# Patient Record
Sex: Female | Born: 1950 | Race: White | Hispanic: No | Marital: Married | Smoking: Never smoker
Health system: Southern US, Community
[De-identification: ages and names within clinical notes are randomized; demographics above are authoritative.]

## PROBLEM LIST (undated history)

## (undated) DIAGNOSIS — H04123 Dry eye syndrome of bilateral lacrimal glands: Secondary | ICD-10-CM

## (undated) DIAGNOSIS — F32A Depression, unspecified: Secondary | ICD-10-CM

## (undated) DIAGNOSIS — N83209 Unspecified ovarian cyst, unspecified side: Secondary | ICD-10-CM

## (undated) DIAGNOSIS — IMO0002 Reserved for concepts with insufficient information to code with codable children: Secondary | ICD-10-CM

## (undated) DIAGNOSIS — Z8669 Personal history of other diseases of the nervous system and sense organs: Secondary | ICD-10-CM

## (undated) DIAGNOSIS — Z5189 Encounter for other specified aftercare: Secondary | ICD-10-CM

## (undated) DIAGNOSIS — L570 Actinic keratosis: Secondary | ICD-10-CM

## (undated) DIAGNOSIS — F419 Anxiety disorder, unspecified: Secondary | ICD-10-CM

## (undated) DIAGNOSIS — Z8742 Personal history of other diseases of the female genital tract: Secondary | ICD-10-CM

## (undated) DIAGNOSIS — F329 Major depressive disorder, single episode, unspecified: Secondary | ICD-10-CM

## (undated) HISTORY — DX: Personal history of other diseases of the female genital tract: Z87.42

## (undated) HISTORY — DX: Reserved for concepts with insufficient information to code with codable children: IMO0002

## (undated) HISTORY — DX: Unspecified ovarian cyst, unspecified side: N83.209

## (undated) HISTORY — DX: Encounter for other specified aftercare: Z51.89

## (undated) HISTORY — PX: TONSILLECTOMY: SUR1361

## (undated) HISTORY — PX: CHOLECYSTECTOMY: SHX55

## (undated) HISTORY — DX: Actinic keratosis: L57.0

## (undated) HISTORY — PX: APPENDECTOMY: SHX54

## (undated) HISTORY — DX: Anxiety disorder, unspecified: F41.9

## (undated) HISTORY — DX: Dry eye syndrome of bilateral lacrimal glands: H04.123

## (undated) HISTORY — DX: Major depressive disorder, single episode, unspecified: F32.9

## (undated) HISTORY — DX: Personal history of other diseases of the nervous system and sense organs: Z86.69

## (undated) HISTORY — DX: Depression, unspecified: F32.A

---

## 1968-09-13 HISTORY — PX: RIGHT OOPHORECTOMY: SHX2359

## 1985-09-13 HISTORY — PX: ABDOMINAL HYSTERECTOMY: SHX81

## 1988-09-13 HISTORY — PX: LAPAROSCOPY: SHX197

## 1998-02-13 ENCOUNTER — Other Ambulatory Visit: Admission: RE | Admit: 1998-02-13 | Discharge: 1998-02-13 | Payer: Self-pay | Admitting: Obstetrics and Gynecology

## 1998-02-19 ENCOUNTER — Other Ambulatory Visit: Admission: RE | Admit: 1998-02-19 | Discharge: 1998-02-19 | Payer: Self-pay | Admitting: *Deleted

## 1999-06-24 ENCOUNTER — Other Ambulatory Visit: Admission: RE | Admit: 1999-06-24 | Discharge: 1999-06-24 | Payer: Self-pay | Admitting: Obstetrics and Gynecology

## 1999-07-29 ENCOUNTER — Ambulatory Visit (HOSPITAL_COMMUNITY): Admission: RE | Admit: 1999-07-29 | Discharge: 1999-07-29 | Payer: Self-pay | Admitting: Obstetrics and Gynecology

## 2000-10-13 ENCOUNTER — Other Ambulatory Visit: Admission: RE | Admit: 2000-10-13 | Discharge: 2000-10-13 | Payer: Self-pay | Admitting: Obstetrics and Gynecology

## 2001-12-14 ENCOUNTER — Other Ambulatory Visit: Admission: RE | Admit: 2001-12-14 | Discharge: 2001-12-14 | Payer: Self-pay | Admitting: Obstetrics and Gynecology

## 2003-02-18 ENCOUNTER — Other Ambulatory Visit: Admission: RE | Admit: 2003-02-18 | Discharge: 2003-02-18 | Payer: Self-pay | Admitting: Obstetrics and Gynecology

## 2004-06-30 ENCOUNTER — Other Ambulatory Visit: Admission: RE | Admit: 2004-06-30 | Discharge: 2004-06-30 | Payer: Self-pay | Admitting: Obstetrics and Gynecology

## 2005-07-12 ENCOUNTER — Ambulatory Visit: Payer: Self-pay | Admitting: Internal Medicine

## 2005-10-11 ENCOUNTER — Ambulatory Visit: Payer: Self-pay | Admitting: Internal Medicine

## 2005-10-22 ENCOUNTER — Ambulatory Visit: Payer: Self-pay | Admitting: Internal Medicine

## 2005-10-25 ENCOUNTER — Ambulatory Visit: Payer: Self-pay | Admitting: Internal Medicine

## 2005-10-27 ENCOUNTER — Encounter: Admission: RE | Admit: 2005-10-27 | Discharge: 2005-10-27 | Payer: Self-pay | Admitting: Internal Medicine

## 2005-10-29 ENCOUNTER — Ambulatory Visit (HOSPITAL_COMMUNITY): Admission: RE | Admit: 2005-10-29 | Discharge: 2005-10-31 | Payer: Self-pay | Admitting: General Surgery

## 2005-10-30 ENCOUNTER — Ambulatory Visit (HOSPITAL_COMMUNITY): Admission: RE | Admit: 2005-10-30 | Discharge: 2005-10-31 | Payer: Self-pay | Admitting: General Surgery

## 2005-10-30 ENCOUNTER — Encounter (INDEPENDENT_AMBULATORY_CARE_PROVIDER_SITE_OTHER): Payer: Self-pay | Admitting: Specialist

## 2005-10-30 ENCOUNTER — Encounter: Payer: Self-pay | Admitting: General Surgery

## 2005-11-03 ENCOUNTER — Encounter: Payer: Self-pay | Admitting: Internal Medicine

## 2005-12-14 ENCOUNTER — Other Ambulatory Visit: Admission: RE | Admit: 2005-12-14 | Discharge: 2005-12-14 | Payer: Self-pay | Admitting: Obstetrics and Gynecology

## 2006-08-18 ENCOUNTER — Ambulatory Visit: Payer: Self-pay | Admitting: Internal Medicine

## 2007-06-06 ENCOUNTER — Other Ambulatory Visit: Admission: RE | Admit: 2007-06-06 | Discharge: 2007-06-06 | Payer: Self-pay | Admitting: Obstetrics and Gynecology

## 2007-07-11 ENCOUNTER — Encounter: Payer: Self-pay | Admitting: Internal Medicine

## 2007-07-11 LAB — HM DEXA SCAN: HM Dexa Scan: NORMAL

## 2007-08-23 ENCOUNTER — Ambulatory Visit: Payer: Self-pay | Admitting: Internal Medicine

## 2008-05-01 ENCOUNTER — Telehealth (INDEPENDENT_AMBULATORY_CARE_PROVIDER_SITE_OTHER): Payer: Self-pay | Admitting: *Deleted

## 2008-05-01 ENCOUNTER — Ambulatory Visit: Payer: Self-pay | Admitting: Internal Medicine

## 2008-05-01 ENCOUNTER — Ambulatory Visit: Payer: Self-pay | Admitting: Cardiology

## 2008-05-01 DIAGNOSIS — R109 Unspecified abdominal pain: Secondary | ICD-10-CM

## 2008-05-01 DIAGNOSIS — F329 Major depressive disorder, single episode, unspecified: Secondary | ICD-10-CM

## 2008-05-01 DIAGNOSIS — H04129 Dry eye syndrome of unspecified lacrimal gland: Secondary | ICD-10-CM | POA: Insufficient documentation

## 2008-05-01 DIAGNOSIS — Z87898 Personal history of other specified conditions: Secondary | ICD-10-CM

## 2008-05-01 LAB — CONVERTED CEMR LAB
ALT: 17 units/L (ref 0–35)
AST: 17 units/L (ref 0–37)
Albumin: 3.9 g/dL (ref 3.5–5.2)
Alkaline Phosphatase: 51 units/L (ref 39–117)
BUN: 22 mg/dL (ref 6–23)
Basophils Relative: 0.8 % (ref 0.0–3.0)
Blood in Urine, dipstick: NEGATIVE
CO2: 29 meq/L (ref 19–32)
Chloride: 105 meq/L (ref 96–112)
Eosinophils Relative: 1.9 % (ref 0.0–5.0)
GFR calc Af Amer: 82 mL/min
Hemoglobin: 13.8 g/dL (ref 12.0–15.0)
MCHC: 34.3 g/dL (ref 30.0–36.0)
Neutro Abs: 3.2 10*3/uL (ref 1.4–7.7)
Potassium: 4.1 meq/L (ref 3.5–5.1)
Protein, U semiquant: NEGATIVE
Specific Gravity, Urine: 1.015
Total Protein: 6.7 g/dL (ref 6.0–8.3)
WBC: 5.6 10*3/uL (ref 4.5–10.5)
pH: 5

## 2008-05-02 ENCOUNTER — Encounter: Payer: Self-pay | Admitting: Internal Medicine

## 2008-05-03 ENCOUNTER — Telehealth: Payer: Self-pay | Admitting: Internal Medicine

## 2008-05-07 ENCOUNTER — Ambulatory Visit: Payer: Self-pay | Admitting: Internal Medicine

## 2008-05-08 ENCOUNTER — Encounter: Admission: RE | Admit: 2008-05-08 | Discharge: 2008-05-08 | Payer: Self-pay | Admitting: Surgery

## 2008-05-08 ENCOUNTER — Encounter: Payer: Self-pay | Admitting: Internal Medicine

## 2008-05-09 ENCOUNTER — Telehealth: Payer: Self-pay | Admitting: Internal Medicine

## 2008-05-09 DIAGNOSIS — M8580 Other specified disorders of bone density and structure, unspecified site: Secondary | ICD-10-CM

## 2008-05-27 ENCOUNTER — Encounter: Payer: Self-pay | Admitting: Internal Medicine

## 2008-06-11 ENCOUNTER — Telehealth: Payer: Self-pay | Admitting: Internal Medicine

## 2010-05-05 ENCOUNTER — Ambulatory Visit: Payer: Self-pay | Admitting: Internal Medicine

## 2010-10-13 NOTE — Assessment & Plan Note (Signed)
Summary: SHINGLES VAC/KN  Nurse Visit   Allergies: 1)  ! Penicillin  Immunizations Administered:  Zostavax # 1:    Vaccine Type: Zostavax    Site: left deltoid    Mfr: Merck    Dose: 0.5 ml    Route: [place]    Given by: Army Fossa CMA    Exp. Date: 04/29/2012    Lot #: [id number]  Orders Added: 1)  Zoster (Shingles) Vaccine Live [90736] 2)  Admin 1st Vaccine [01027]

## 2011-01-29 NOTE — Op Note (Signed)
NAME:  Kelly Schneider, Kelly Schneider NO.:  [id number]   MEDICAL RECORD NO.:  [id number]          PATIENT TYPE:  OIB   LOCATION:  1612                         FACILITY:  [facility]   PHYSICIAN:  Anselm Pancoast. Weatherly, M.D.DATE OF BIRTH:  1950/11/17   DATE OF PROCEDURE:  10/30/2005  DATE OF DISCHARGE:                                 OPERATIVE REPORT   PREOPERATIVE DIAGNOSIS:  Chronic cholecystitis with stones.   POSTOPERATIVE DIAGNOSIS:  Chronic cholecystitis with stones.   OPERATION:  Laparoscopic cholecystectomy with cholangiogram.   SURGEON:  Dr. Consuello Bossier   ASSISTANT:  Dr. Cyndia Bent.   ANESTHESIA:  General.   HISTORY:  Kelly Schneider is a 60 year old Caucasian female, whom I first  saw in the office yesterday with the following history.  She has had some  intermittent episodes of right upper abdominal pain and right shoulder pain.  She sees Dr. __________  as her regular physician and also has never had a  colonoscopy and was scheduled for a colonoscopy that was done,  I think, by  Dr. Leone Payor on Monday.  She felt fine on Monday night and then on Wednesday  night, had an episode of significant abdominal pain and this time saw Dr.  __________ .  They scheduled an ultrasound that was done Thursday that  showed large stone within a swollen gallbladder, but it was not acutely  inflamed.  She was advised to see a surgeon, and I saw her yesterday, and  she gives a history that she has had fever she says for about 2 weeks, but  on the fever it appears that her temperature goes up to 100, is not a  significant fever, and then she said that when she had been seen for this  previously, they had mentioned that she had some blood in her urine, but her  urine culture did not show any bacteria.  I found on physical exam she was  kind of mildly tender in the right upper quadrant, but she was definitely  tender in the lower abdomen and had not had a white count or liver  test, and  I sent her over yesterday afternoon for a CAT scan of the abdomen and pelvis  and also lab studies.  The lab studies were unremarkable, and the CT did not  show any evidence of any inflammation or whatever the colon.  Wondered if  somehow or another the colonoscopy that had been done Monday had kind up  stirred up a problem.  I advised that we go ahead and add her to the OR  schedule, and she is here today for the planned procedure.  She also has had  a past history of endometriosis and has had a hysterectomy with bilateral  salpingo-oophorectomy years ago through a low Pfannenstiel incision and then  one time since then, she has had a laparoscopic lysis of adhesions.  She  said they had to clean out some scar tissue.  But was whether there was a  recurrence of that also is an issue, but nothing was seen on the CT.   Preoperatively, she was given 400  mg of Cipro, taken back to the operative  suite, induction of general anesthesia.  The abdomen was prepped with  Betadine surgical scrub and solution and draped in a sterile manner.  A  small vertical incision was made at the umbilicus.  The fascia was  identified, kind of carefully opened through this, but there is a little  scarring right at the omentum, and we very carefully entered into the  peritoneal cavity.  The little peritoneum was picked up, opened, and then  the pursestring suture placed in the fascia.  Hasson cannula was introduced  in the upper abdomen.  The gallbladder is not acutely inflamed, but it is  significantly distended chronically with a lot adhesions up around it, as  she has had previous episodes of pain.  The upper 10 mm trocar was placed in  the subxiphoid area, and the two 5 mm trocars were placed in the appropriate  right lateral position, straight.  With placing the camera in the upper 10  mm trocar and looking at the pelvis, she has significant, kind of  chronically scarring to her sigmoid colon that  goes to the upper area of the  Pfannenstiel incision, and I think some of this lower abdominal cramping is  probably related to these adhesions to the colon.  There were some little  thin adhesions up around the umbilicus.  These were dropped down before  directing our attention to the gallbladder.  The gallbladder was grasped,  retracted upward and outward, and the omentum that was kind of adherent to  this was kind of carefully stripped down.  Blood vessels were clipped and  cauterized on the gallbladder side, and then the proximal portion of  gallbladder was identified; the cystic duct was dissected out.  A clip flush  with the gallbladder, a Cook catheter introduced, and x-ray obtained, showed  good filling of the extrahepatic biliary system and good flow into the  duodenum.  Three clips were placed on the cystic duct and then divided.  The  cystic artery was next identified and doubly clipped proximally, singly  distally, and divided.  And then the gallbladder was freed from its bed with  hemostasis obtained by electrocautery.  I placed the gallbladder in the  EndoCatch bag and brought it out through the umbilicus and then closed the  little pursestring.  Two additional figure-of-eight sutures were placed for  closure of the fascia.  Then, looking again at the adhesions in the pelvis,  if she is continuing to have chronic episodes of lower abdominal crampy  pain, the adhesions to the colon could be taken down, and they certainly  look not the little flimsy adhesions like you sometimes see but as if she  has had some more significant episodes of cramping, and I could, I think,  laparoscopically remove these if they were symptomatic.  As far as the  present right lower quadrant tenderness, I expect it was more related to the  colonoscopy and would not recommend doing anything about this at this time.  Inspection back up the gallbladder fossa.  There was no evidence of any bleeding.  The  irrigating fluid had been removed, and we aspirated this  fluid, removed the two 5 mm ports under direct vision.  No bleeding,  released the carbon dioxide and removed the upper 10 mm trocar.  Marcaine  had been used in all the trocar sites, and then the subcutaneous wounds were  closed with 4-0 Vicryl.  Benzoin and  Steri-Strips on the skin.  We will plan  on keeping her overnight unless she is feeling very well this afternoon, and  she should be released in the a.m. __________           ______________________________  Anselm Pancoast. Zachery Dakins, M.D.     WJW/MEDQ  D:  10/30/2005  T:  10/30/2005  Job:  [id number]   cc:   Iva Boop, M.D. [facility]  [address] [place], [place] [address]

## 2011-01-29 NOTE — Op Note (Signed)
[facility]  Patient:    Kelly Schneider                    MRN: [id number] Proc. Date: 07/29/99 Adm. Date:  [id number] Attending:  Wandalee Ferdinand                           Operative Report  PREOPERATIVE DIAGNOSES:       1. Status post total abdominal hysterectomy and                                  bilateral salpingo-oophorectomy in separate                                  settings.                               2. History of endometriosis.                               3. Pelvic pain - debilitating.  POSTOPERATIVE DIAGNOSIS:      Pelvic adhesions.  PROCEDURE:                    Diagnostic/operative laparoscopy.  SURGEON:                      Rudy Jew. Ashley Royalty, M.D.  ANESTHESIA:                   General.  ESTIMATED BLOOD LOSS:         Less than 50 cc.  COMPLICATIONS:                None.  PACKS AND DRAINS:             None.  DESCRIPTION OF PROCEDURE:     Patient was taken to the operating room and placed in the dorsal supine position.  After adequate general endotracheal anesthesia was  administered, she was placed in the lithotomy position, and prepped and draped n the usual manner for abdominal and vaginal surgery.  The bladder was drained with a red rubber catheter.  A 1.5 cm infraumbilical incision was made in the transverse plane.  The disposable size 10/11 laparoscopic trocar was placed in the abdominal cavity without difficulty.  Its location was verified by placement of the laparoscope.  There was no evidence of any trauma.  Pneumoperitoneum was created with CO2.  Next, a suprapubic incision was made in the left lower quadrant, in anticipation of placement of a 5 mm trocar.  However, upon visualizing the area  attended by the trocar, the patient was noted to have extensive pelvic adhesions, including the sigmoid colon and small bowel to the left aspect of the lower anterior abdominal wall.  Hence, this trocar  site was abandoned, after simply making the skin incision.  Two additional trocars were placed, in the midline and the right lower quadrant respectively, using transillumination and direct visualization techniques.  The entire abdomen and pelvis was surveyed.  Aside from the extensive large and small bowel adhesions to the inferior aspect of the left anterior abdominal wall, there were also filmy adhesions in  the upper abdomen.  However, the patient was not symptomatic in these areas, and the decision was made to leave those areas undisturbed.  Attention was focused on the aforementioned adhesions.  Using gentle traction and the YAG laser with a ______ tip and 14 watts power, these adhesions were serially lysed using judicious judgement to avoid, f at all possible, injury to the bowel.  There was no evidence of any trauma. Appropriate photos were obtained before and after.  After the adhesions were successfully lysed, the patient was felt to have benefited maximally from the surgical procedure.  Hence, the abdominal instruments were removed and pneumoperitoneum evacuated.  Fascial defects were closed with 2-0 Vicryl in an interrupted fashion.  The skin was closed with 3-0 chromic in a subcuticular fashion.  Marcaine 0.25% 10cc was instilled into the surgical incisions to aid n postoperative analgesia.  Patient tolerated the procedure extremely well, and was returned to the recovery room in good condition. DD:  07/30/99 TD:  07/30/99 Job: [id number]

## 2011-04-22 ENCOUNTER — Encounter: Payer: Self-pay | Admitting: Internal Medicine

## 2011-09-30 ENCOUNTER — Other Ambulatory Visit: Payer: Self-pay | Admitting: Obstetrics and Gynecology

## 2013-01-30 ENCOUNTER — Other Ambulatory Visit: Payer: Self-pay

## 2013-02-22 ENCOUNTER — Other Ambulatory Visit: Payer: Self-pay

## 2013-04-03 ENCOUNTER — Other Ambulatory Visit: Payer: Self-pay | Admitting: Obstetrics and Gynecology

## 2014-01-31 ENCOUNTER — Other Ambulatory Visit: Payer: Self-pay

## 2014-01-31 DIAGNOSIS — L821 Other seborrheic keratosis: Secondary | ICD-10-CM | POA: Diagnosis not present

## 2014-01-31 DIAGNOSIS — L738 Other specified follicular disorders: Secondary | ICD-10-CM | POA: Diagnosis not present

## 2014-01-31 DIAGNOSIS — L723 Sebaceous cyst: Secondary | ICD-10-CM | POA: Diagnosis not present

## 2014-01-31 DIAGNOSIS — D485 Neoplasm of uncertain behavior of skin: Secondary | ICD-10-CM | POA: Diagnosis not present

## 2014-01-31 DIAGNOSIS — L570 Actinic keratosis: Secondary | ICD-10-CM | POA: Diagnosis not present

## 2014-01-31 DIAGNOSIS — L819 Disorder of pigmentation, unspecified: Secondary | ICD-10-CM | POA: Diagnosis not present

## 2014-01-31 DIAGNOSIS — D1801 Hemangioma of skin and subcutaneous tissue: Secondary | ICD-10-CM | POA: Diagnosis not present

## 2014-02-15 ENCOUNTER — Other Ambulatory Visit: Payer: Self-pay

## 2014-02-15 DIAGNOSIS — D2330 Other benign neoplasm of skin of unspecified part of face: Secondary | ICD-10-CM | POA: Diagnosis not present

## 2014-02-15 DIAGNOSIS — D485 Neoplasm of uncertain behavior of skin: Secondary | ICD-10-CM | POA: Diagnosis not present

## 2014-02-15 DIAGNOSIS — D230 Other benign neoplasm of skin of lip: Secondary | ICD-10-CM | POA: Diagnosis not present

## 2014-04-05 DIAGNOSIS — R300 Dysuria: Secondary | ICD-10-CM | POA: Diagnosis not present

## 2014-04-05 DIAGNOSIS — N393 Stress incontinence (female) (male): Secondary | ICD-10-CM | POA: Diagnosis not present

## 2014-04-05 DIAGNOSIS — Z1231 Encounter for screening mammogram for malignant neoplasm of breast: Secondary | ICD-10-CM | POA: Diagnosis not present

## 2014-08-12 DIAGNOSIS — Z23 Encounter for immunization: Secondary | ICD-10-CM | POA: Diagnosis not present

## 2014-09-27 DIAGNOSIS — H524 Presbyopia: Secondary | ICD-10-CM | POA: Diagnosis not present

## 2014-09-27 DIAGNOSIS — H04123 Dry eye syndrome of bilateral lacrimal glands: Secondary | ICD-10-CM | POA: Diagnosis not present

## 2014-09-27 DIAGNOSIS — H2513 Age-related nuclear cataract, bilateral: Secondary | ICD-10-CM | POA: Diagnosis not present

## 2015-01-08 ENCOUNTER — Ambulatory Visit (INDEPENDENT_AMBULATORY_CARE_PROVIDER_SITE_OTHER): Payer: Medicare Other | Admitting: Internal Medicine

## 2015-01-08 ENCOUNTER — Encounter: Payer: Self-pay | Admitting: Internal Medicine

## 2015-01-08 ENCOUNTER — Ambulatory Visit (HOSPITAL_BASED_OUTPATIENT_CLINIC_OR_DEPARTMENT_OTHER)
Admission: RE | Admit: 2015-01-08 | Discharge: 2015-01-08 | Disposition: A | Discharge status: Home / Self Care | Payer: Medicare Other | Source: Ambulatory Visit | Attending: Internal Medicine | Admitting: Internal Medicine

## 2015-01-08 VITALS — BP 116/68 | HR 74 | Temp 97.9°F | Ht 63.0 in | Wt 134.1 lb

## 2015-01-08 DIAGNOSIS — M419 Scoliosis, unspecified: Secondary | ICD-10-CM | POA: Diagnosis not present

## 2015-01-08 DIAGNOSIS — R1010 Upper abdominal pain, unspecified: Secondary | ICD-10-CM

## 2015-01-08 DIAGNOSIS — Z9049 Acquired absence of other specified parts of digestive tract: Secondary | ICD-10-CM | POA: Insufficient documentation

## 2015-01-08 DIAGNOSIS — M546 Pain in thoracic spine: Secondary | ICD-10-CM

## 2015-01-08 DIAGNOSIS — M5134 Other intervertebral disc degeneration, thoracic region: Secondary | ICD-10-CM | POA: Diagnosis not present

## 2015-01-08 DIAGNOSIS — R072 Precordial pain: Secondary | ICD-10-CM | POA: Diagnosis not present

## 2015-01-08 NOTE — Progress Notes (Signed)
Pre visit review using our clinic review tool, if applicable. No additional management support is needed unless otherwise documented below in the visit note. 

## 2015-01-08 NOTE — Assessment & Plan Note (Signed)
  64 year old female, very healthy, presents with 3 weeks history of upper abdominal pain, right-sided, sounds mechanical in nature. On exam she is tender at the upper abdomen but also of the thoracic spine. She has a history of a complete hysterectomy, cholecystectomy and appendectomy. DDX includes radicular pain from the thoracic spine, PUD, intrathoracic or a  intra-abdominal processes. Will start a workup with a chest x-ray, thoracic spine x-ray and labs. OTC Prilosec daily Further advice w/ results

## 2015-01-08 NOTE — Progress Notes (Signed)
Subjective:    Patient ID: Kelly Schneider, female    DOB: 1951/03/26, 64 y.o.   MRN: [id number]  DOS:  01/08/2015 Type of visit - description : new pt acute Interval history: Symptoms started 3 weeks ago with pain at the right upper quadrant of the abdomen, onset was gradual, getting worse. The pain is not steady, actually hurts whenever she moves her torso or leans her back against the chair. Described as a discomfort or achiness. No change with food intake. Denies any rash She admits to heavy yard work prior to the onset of symptoms.    Review of Systems Denies fever chills No chest pain, difficulty breathing, no recent prolonged car trips No nausea, vomiting, diarrhea. No change in the color of the stools or GERD symptoms. Not taking Motrin or similar medications lately Gross hematuria. No difficulty urinating.  Past Medical History  Diagnosis Date  . Depression     h/o  . Dry eyes   . Hx of migraines   . Hx of endometriosis     Past Surgical History  Procedure Laterality Date  . Right oophorectomy  1970  . Tonsillectomy    . Appendectomy    . Cholecystectomy    . Abdominal hysterectomy  1987    Total  . Laparoscopy  1990    For Endometriosis    History   Social History  . Marital Status: Married    Spouse Name: N/A  . Number of Children: 0  . Years of Education: N/A   Occupational History  . retired    Social History Main Topics  . Smoking status: Never Smoker   . Smokeless tobacco: Not on file  . Alcohol Use: No  . Drug Use: No  . Sexual Activity: Not on file   Other Topics Concern  . Not on file   Social History Narrative   Married w/ george, he has 1 child      Family History  Problem Relation Age of Onset  . CAD Other     GM, uncle  . Diabetes Other     mother side   . Colon cancer Neg Hx   . Breast cancer Neg Hx       Medication List       This list is accurate as of: 01/08/15  5:52 PM.  Always use your most recent med  list.               cycloSPORINE 0.05 % ophthalmic emulsion  Commonly known as:  RESTASIS  Place 1 drop into both eyes 2 (two) times daily.     estrogens (conjugated) 0.625 MG tablet  Commonly known as:  PREMARIN  Take 0.625 mg by mouth daily. Take daily for 21 days then do not take for 7 days.           Objective:   Physical Exam BP 116/68 mmHg  Pulse 74  Temp(Src) 97.9 F (36.6 C) (Oral)  Ht 5\' 3"  (1.6 m)  Wt 134 lb 2 oz (60.839 kg)  BMI 23.77 kg/m2  SpO2 98% General:   Well developed, well nourished . NAD, healthy-appearing.  Neck:  Full range of motion. Supple. No  thyromegaly , mass or LAD Lungs:  CTA B Normal respiratory effort, no intercostal retractions, no accessory muscle use. Heart: RRR,  no murmur.  Abdomen:  Not distended, soft, moderately tender without mass or rebound at the epigastric area and right side. No  organomegaly Muscle skeletal: no pretibial  edema bilaterally  Back: Quite tender specifically around T7 or T8 otherwise no TTP  Skin: Exposed areas without rash. Not pale. Not jaundice Neurologic:  alert & oriented X3.  Speech normal, gait appropriate for age and unassisted Strength symmetric and appropriate for age.  DTRs symmetric Posture somewhat antalgic  Psych: Cognition and judgment appear intact.  Cooperative with normal attention span and concentration.  Behavior appropriate. No anxious or depressed appearing.        Assessment & Plan:

## 2015-01-08 NOTE — Patient Instructions (Signed)
Get your blood work before you leave   Stop by the first floor and get the XR   Take Prilosec 20 mg OTC 1 tablet before breakfast every day  Tylenol as needed for pain

## 2015-01-09 LAB — COMPREHENSIVE METABOLIC PANEL
ALT: 12 U/L (ref 0–35)
AST: 15 U/L (ref 0–37)
Albumin: 4.3 g/dL (ref 3.5–5.2)
Alkaline Phosphatase: 59 U/L (ref 39–117)
BUN: 18 mg/dL (ref 6–23)
CALCIUM: 9.6 mg/dL (ref 8.4–10.5)
CHLORIDE: 102 meq/L (ref 96–112)
CO2: 29 meq/L (ref 19–32)
Creatinine, Ser: 0.83 mg/dL (ref 0.40–1.20)
GFR: 73.04 mL/min (ref >60.00)
Glucose, Bld: 86 mg/dL (ref 70–99)
Potassium: 4.1 mEq/L (ref 3.5–5.1)
SODIUM: 137 meq/L (ref 135–145)
TOTAL PROTEIN: 6.8 g/dL (ref 6.0–8.3)
Total Bilirubin: 0.4 mg/dL (ref 0.2–1.2)

## 2015-01-09 LAB — URINALYSIS, ROUTINE W REFLEX MICROSCOPIC
BILIRUBIN URINE: NEGATIVE
HGB URINE DIPSTICK: NEGATIVE
Ketones, ur: NEGATIVE
Leukocytes, UA: NEGATIVE
NITRITE: NEGATIVE
RBC / HPF: NONE SEEN (ref 0–?)
Specific Gravity, Urine: >=1.03 — AB (ref 1.000–1.030)
Total Protein, Urine: NEGATIVE
URINE GLUCOSE: NEGATIVE
UROBILINOGEN UA: 0.2 (ref 0.0–1.0)
WBC UA: NONE SEEN (ref 0–?)
pH: 5.5 (ref 5.0–8.0)

## 2015-01-09 LAB — CBC WITH DIFFERENTIAL/PLATELET
BASOS PCT: 0.6 % (ref 0.0–3.0)
Basophils Absolute: 0 10*3/uL (ref 0.0–0.1)
EOS ABS: 0.1 10*3/uL (ref 0.0–0.7)
EOS PCT: 1.5 % (ref 0.0–5.0)
HCT: 39.9 % (ref 36.0–46.0)
Hemoglobin: 13.6 g/dL (ref 12.0–15.0)
LYMPHS PCT: 24.9 % (ref 12.0–46.0)
Lymphs Abs: 1.7 10*3/uL (ref 0.7–4.0)
MCHC: 34.1 g/dL (ref 30.0–36.0)
MCV: 86.5 fl (ref 78.0–100.0)
MONO ABS: 0.5 10*3/uL (ref 0.1–1.0)
Monocytes Relative: 7.8 % (ref 3.0–12.0)
NEUTROS ABS: 4.4 10*3/uL (ref 1.4–7.7)
NEUTROS PCT: 65.2 % (ref 43.0–77.0)
Platelets: 263 10*3/uL (ref 150.0–400.0)
RBC: 4.61 Mil/uL (ref 3.87–5.11)
RDW: 13.7 % (ref 11.5–15.5)
WBC: 6.7 10*3/uL (ref 4.0–10.5)

## 2015-01-09 LAB — AMYLASE: AMYLASE: 24 U/L — AB (ref 27–131)

## 2015-01-09 LAB — LIPASE: Lipase: 46 U/L (ref 11.0–59.0)

## 2015-02-05 DIAGNOSIS — L821 Other seborrheic keratosis: Secondary | ICD-10-CM | POA: Diagnosis not present

## 2015-02-05 DIAGNOSIS — L570 Actinic keratosis: Secondary | ICD-10-CM | POA: Diagnosis not present

## 2015-02-05 DIAGNOSIS — D224 Melanocytic nevi of scalp and neck: Secondary | ICD-10-CM | POA: Diagnosis not present

## 2015-02-05 DIAGNOSIS — D225 Melanocytic nevi of trunk: Secondary | ICD-10-CM | POA: Diagnosis not present

## 2015-02-05 DIAGNOSIS — D1801 Hemangioma of skin and subcutaneous tissue: Secondary | ICD-10-CM | POA: Diagnosis not present

## 2015-02-05 DIAGNOSIS — L814 Other melanin hyperpigmentation: Secondary | ICD-10-CM | POA: Diagnosis not present

## 2015-02-05 DIAGNOSIS — D485 Neoplasm of uncertain behavior of skin: Secondary | ICD-10-CM | POA: Diagnosis not present

## 2015-03-10 ENCOUNTER — Other Ambulatory Visit: Payer: Self-pay

## 2015-03-18 ENCOUNTER — Ambulatory Visit (INDEPENDENT_AMBULATORY_CARE_PROVIDER_SITE_OTHER): Payer: Medicare Other | Admitting: Internal Medicine

## 2015-03-18 ENCOUNTER — Encounter: Payer: Self-pay | Admitting: Internal Medicine

## 2015-03-18 VITALS — BP 120/64 | HR 65 | Temp 97.9°F | Ht 63.0 in | Wt 136.2 lb

## 2015-03-18 DIAGNOSIS — R1010 Upper abdominal pain, unspecified: Secondary | ICD-10-CM

## 2015-03-18 DIAGNOSIS — Z136 Encounter for screening for cardiovascular disorders: Secondary | ICD-10-CM

## 2015-03-18 NOTE — Assessment & Plan Note (Addendum)
Since the last visit, labs and x-rays were normal. Patient is now asymptomatic. On exam, she is nontender but I did feel a pulsatile mass, likely an enlarged aorta or a AAA. Plan:  Discontinue Prilosec Will do a screening for AAA given physical examination (needs to be after July 18)

## 2015-03-18 NOTE — Progress Notes (Signed)
   Subjective:    Patient ID: Kelly Schneider, female    DOB: 07-02-1951, 64 y.o.   MRN: [id number]  DOS:  03/18/2015 Type of visit - description : Follow-up Interval history: Follow-up from previous visit, abdominal pain, back pain resolved. On Prilosec.  Review of Systems   Denies nausea, vomiting, diarrhea  Past Medical History  Diagnosis Date  . Depression     h/o  . Dry eyes   . Hx of migraines   . Hx of endometriosis   . Actinic keratosis     Right superior central posterior tibial shave, Dr. Fontaine No    Past Surgical History  Procedure Laterality Date  . Right oophorectomy  1970  . Tonsillectomy    . Appendectomy    . Cholecystectomy    . Abdominal hysterectomy  1987    Total  . Laparoscopy  1990    For Endometriosis    History   Social History  . Marital Status: Married    Spouse Name: N/A  . Number of Children: 0  . Years of Education: N/A   Occupational History  . retired    Social History Main Topics  . Smoking status: Never Smoker   . Smokeless tobacco: Not on file  . Alcohol Use: No  . Drug Use: No  . Sexual Activity: Not on file   Other Topics Concern  . Not on file   Social History Narrative   Married w/ george, he has 1 child         Medication List       This list is accurate as of: 03/18/15  5:33 PM.  Always use your most recent med list.               cycloSPORINE 0.05 % ophthalmic emulsion  Commonly known as:  RESTASIS  Place 1 drop into both eyes 2 (two) times daily.     estradiol 1 MG tablet  Commonly known as:  ESTRACE  Take 1 mg by mouth daily.     estrogens (conjugated) 0.625 MG tablet  Commonly known as:  PREMARIN  Take 0.625 mg by mouth daily. Take daily for 21 days then do not take for 7 days.           Objective:   Physical Exam BP 120/64 mmHg  Pulse 65  Temp(Src) 97.9 F (36.6 C) (Oral)  Ht 5\' 3"  (1.6 m)  Wt 136 lb 4 oz (61.803 kg)  BMI 24.14 kg/m2  SpO2 99%  General:   Well developed,  well nourished . NAD.  HEENT:  Normocephalic . Face symmetric, atraumatic Abdomen:  Not distended, soft, non-tender. No rebound or rigidity. Pulsatile, nontender mass in the epigastrium. Not bruit skin: Not pale. Not jaundice Neurologic:  alert & oriented X3.  Speech normal, gait appropriate for age and unassisted Psych--  Cognition and judgment appear intact.  Cooperative with normal attention span and concentration.  Behavior appropriate. No anxious or depressed appearing.      Assessment & Plan:    Recommend to come back in few months for a physical exam (although she gets her regular checkups at gynecologic)

## 2015-03-18 NOTE — Patient Instructions (Signed)
Will schedule a ultrasound of the aorta

## 2015-03-18 NOTE — Progress Notes (Signed)
Pre visit review using our clinic review tool, if applicable. No additional management support is needed unless otherwise documented below in the visit note. 

## 2015-03-25 ENCOUNTER — Other Ambulatory Visit: Payer: Self-pay

## 2015-03-25 ENCOUNTER — Telehealth: Payer: Self-pay | Admitting: Internal Medicine

## 2015-03-25 DIAGNOSIS — Z136 Encounter for screening for cardiovascular disorders: Secondary | ICD-10-CM

## 2015-03-25 NOTE — Telephone Encounter (Signed)
error:[id number] ° °

## 2015-04-03 ENCOUNTER — Other Ambulatory Visit: Payer: Self-pay

## 2015-04-03 DIAGNOSIS — R0989 Other specified symptoms and signs involving the circulatory and respiratory systems: Secondary | ICD-10-CM

## 2015-04-03 DIAGNOSIS — R1900 Intra-abdominal and pelvic swelling, mass and lump, unspecified site: Secondary | ICD-10-CM

## 2015-04-03 DIAGNOSIS — Z136 Encounter for screening for cardiovascular disorders: Secondary | ICD-10-CM

## 2015-04-04 ENCOUNTER — Other Ambulatory Visit: Payer: Self-pay

## 2015-04-04 DIAGNOSIS — Z780 Asymptomatic menopausal state: Secondary | ICD-10-CM

## 2015-04-10 ENCOUNTER — Telehealth: Payer: Self-pay | Admitting: Internal Medicine

## 2015-04-10 NOTE — Telephone Encounter (Signed)
Please call the Pt and schedule her appt for 04/15/2015 at her convenience. I will be leaving the office at 12 noon today and will not return until Monday August 1. Thank you.

## 2015-04-10 NOTE — Telephone Encounter (Signed)
Relation to pt: self  Call back number:[phone]   Reason for call:  Patient states as per mychart her immunization are overdue and would like her cholesterol check. Patient would like to scheule an appointment on 04/15/15 due to patient having a fasting ultrasound 04/15/15 downstairs at imaging. Please follow up with patient directly. Thank you

## 2015-04-10 NOTE — Telephone Encounter (Addendum)
Patient would like her tetanus and pneumonia injection on 04/15/15 and wanted to know by receiving both injections can it have an adverse reaction? Pt will schedule a cholesterol check on another day due to fasting.

## 2015-04-11 DIAGNOSIS — Z1231 Encounter for screening mammogram for malignant neoplasm of breast: Secondary | ICD-10-CM | POA: Diagnosis not present

## 2015-04-11 NOTE — Telephone Encounter (Signed)
OK to have on same day.  Will obtain order for these immunizations from Dr. Larose Kells for immunizations when he returns to office.

## 2015-04-15 ENCOUNTER — Ambulatory Visit (HOSPITAL_BASED_OUTPATIENT_CLINIC_OR_DEPARTMENT_OTHER)
Admission: RE | Admit: 2015-04-15 | Discharge: 2015-04-15 | Disposition: A | Discharge status: Home / Self Care | Payer: Medicare Other | Source: Ambulatory Visit | Attending: Internal Medicine | Admitting: Internal Medicine

## 2015-04-15 ENCOUNTER — Ambulatory Visit: Payer: Medicare Other | Admitting: Internal Medicine

## 2015-04-15 ENCOUNTER — Ambulatory Visit (INDEPENDENT_AMBULATORY_CARE_PROVIDER_SITE_OTHER): Payer: Medicare Other | Admitting: *Deleted

## 2015-04-15 DIAGNOSIS — Z13820 Encounter for screening for osteoporosis: Secondary | ICD-10-CM | POA: Diagnosis not present

## 2015-04-15 DIAGNOSIS — R0989 Other specified symptoms and signs involving the circulatory and respiratory systems: Secondary | ICD-10-CM

## 2015-04-15 DIAGNOSIS — Z780 Asymptomatic menopausal state: Secondary | ICD-10-CM

## 2015-04-15 DIAGNOSIS — R1900 Intra-abdominal and pelvic swelling, mass and lump, unspecified site: Secondary | ICD-10-CM

## 2015-04-15 DIAGNOSIS — Z136 Encounter for screening for cardiovascular disorders: Secondary | ICD-10-CM | POA: Diagnosis not present

## 2015-04-15 DIAGNOSIS — Z23 Encounter for immunization: Secondary | ICD-10-CM | POA: Diagnosis not present

## 2015-04-15 DIAGNOSIS — Z90710 Acquired absence of both cervix and uterus: Secondary | ICD-10-CM | POA: Diagnosis not present

## 2015-04-15 NOTE — Progress Notes (Signed)
Pre visit review using our clinic review tool, if applicable. No additional management support is needed unless otherwise documented below in the visit note. Verbal order from Dr. Larose Kells that [place] to give Tdap and Prevnar-13 and OK to give on same day.    Patient tolerated injections well.

## 2015-10-03 DIAGNOSIS — H524 Presbyopia: Secondary | ICD-10-CM | POA: Diagnosis not present

## 2015-10-03 DIAGNOSIS — H2513 Age-related nuclear cataract, bilateral: Secondary | ICD-10-CM | POA: Diagnosis not present

## 2015-10-03 DIAGNOSIS — H04123 Dry eye syndrome of bilateral lacrimal glands: Secondary | ICD-10-CM | POA: Diagnosis not present

## 2015-12-24 ENCOUNTER — Encounter: Payer: Self-pay | Admitting: Internal Medicine

## 2016-01-16 ENCOUNTER — Encounter: Payer: Medicare Other | Admitting: Internal Medicine

## 2016-02-13 DIAGNOSIS — D2239 Melanocytic nevi of other parts of face: Secondary | ICD-10-CM | POA: Diagnosis not present

## 2016-02-13 DIAGNOSIS — D225 Melanocytic nevi of trunk: Secondary | ICD-10-CM | POA: Diagnosis not present

## 2016-02-13 DIAGNOSIS — D692 Other nonthrombocytopenic purpura: Secondary | ICD-10-CM | POA: Diagnosis not present

## 2016-02-13 DIAGNOSIS — L720 Epidermal cyst: Secondary | ICD-10-CM | POA: Diagnosis not present

## 2016-02-13 DIAGNOSIS — L821 Other seborrheic keratosis: Secondary | ICD-10-CM | POA: Diagnosis not present

## 2016-02-13 DIAGNOSIS — L814 Other melanin hyperpigmentation: Secondary | ICD-10-CM | POA: Diagnosis not present

## 2016-02-13 DIAGNOSIS — D1801 Hemangioma of skin and subcutaneous tissue: Secondary | ICD-10-CM | POA: Diagnosis not present

## 2016-05-07 ENCOUNTER — Other Ambulatory Visit: Payer: Self-pay

## 2016-05-14 ENCOUNTER — Encounter: Payer: Self-pay | Admitting: Internal Medicine

## 2016-06-29 ENCOUNTER — Ambulatory Visit (AMBULATORY_SURGERY_CENTER): Payer: Self-pay

## 2016-06-29 VITALS — Ht 63.5 in | Wt 147.0 lb

## 2016-06-29 DIAGNOSIS — Z1211 Encounter for screening for malignant neoplasm of colon: Secondary | ICD-10-CM

## 2016-06-29 NOTE — Progress Notes (Signed)
Per pt, no allergies to soy or egg products.Pt not taking any weight loss meds or using  O2 at home. 

## 2016-07-12 ENCOUNTER — Telehealth: Payer: Self-pay | Admitting: Internal Medicine

## 2016-07-12 NOTE — Telephone Encounter (Addendum)
Returned patient's call and she states that she is running low grade temperature which is 97.2 and her normal temp is 96.2.  She also states that she had some nausea and some coughing.  I advised her that she would be okay to continue with the procedure tomorrow as scheduled.  I also advised her that if her symptoms would worsen and temperature goes higher than 100.0, then she would need to call us at [phone].  She stated that she would call us by 3:00 pm and confirm if she is worse or is coming in for her procedure.  All questions were answered and we will await her call for confirmation.    Patient called at 2:25 pm and wanted to know if it was ok to take an Alkaseltzer plus today and tonight for her cold symptoms.  I asked Fabio Asa for advise and she stated it would be fine.  I told patient and she states that she will take that and come in tomorrow for her procedure.

## 2016-07-13 ENCOUNTER — Encounter: Payer: Self-pay | Admitting: Internal Medicine

## 2016-07-13 ENCOUNTER — Ambulatory Visit (AMBULATORY_SURGERY_CENTER): Payer: Medicare Other | Admitting: Internal Medicine

## 2016-07-13 VITALS — BP 123/54 | HR 63 | Temp 96.8°F | Resp 21 | Ht 63.5 in | Wt 147.0 lb

## 2016-07-13 DIAGNOSIS — Z1212 Encounter for screening for malignant neoplasm of rectum: Secondary | ICD-10-CM

## 2016-07-13 DIAGNOSIS — Z1211 Encounter for screening for malignant neoplasm of colon: Secondary | ICD-10-CM

## 2016-07-13 DIAGNOSIS — F329 Major depressive disorder, single episode, unspecified: Secondary | ICD-10-CM | POA: Diagnosis not present

## 2016-07-13 LAB — HM COLONOSCOPY

## 2016-07-13 MED ORDER — SODIUM CHLORIDE 0.9 % IV SOLN: 500.0000 mL | INTRAVENOUS | Status: DC

## 2016-07-13 NOTE — Patient Instructions (Addendum)
   Mo polyps or cancer seen!  Next routine colonoscopy/screening test in 10 years - 2027  I appreciate the opportunity to care for you. Gatha Mayer, MD, FACG  YOU HAD AN ENDOSCOPIC PROCEDURE TODAY AT [facility]:   Refer to the procedure report that was given to you for any specific questions about what was found during the examination.  If the procedure report does not answer your questions, please call your gastroenterologist to clarify.  If you requested that your care partner not be given the details of your procedure findings, then the procedure report has been included in a sealed envelope for you to review at your convenience later.  YOU SHOULD EXPECT: Some feelings of bloating in the abdomen. Passage of more gas than usual.  Walking can help get rid of the air that was put into your GI tract during the procedure and reduce the bloating. If you had a lower endoscopy (such as a colonoscopy or flexible sigmoidoscopy) you may notice spotting of blood in your stool or on the toilet paper. If you underwent a bowel prep for your procedure, you may not have a normal bowel movement for a few days.  Please Note:  You might notice some irritation and congestion in your nose or some drainage.  This is from the oxygen used during your procedure.  There is no need for concern and it should clear up in a day or so.  SYMPTOMS TO REPORT IMMEDIATELY:   Following lower endoscopy (colonoscopy or flexible sigmoidoscopy):  Excessive amounts of blood in the stool  Significant tenderness or worsening of abdominal pains  Swelling of the abdomen that is new, acute  Fever of 100F or higher  For urgent or emergent issues, a gastroenterologist can be reached at any hour by calling [phone].  DIET:  We do recommend a small meal at first, but then you may proceed to your regular diet.  Drink plenty of fluids but you should avoid alcoholic beverages for 24 hours.  ACTIVITY:  You  should plan to take it easy for the rest of today and you should NOT DRIVE or use heavy machinery until tomorrow (because of the sedation medicines used during the test).    FOLLOW UP: Our staff will call the number listed on your records the next business day following your procedure to check on you and address any questions or concerns that you may have regarding the information given to you following your procedure. If we do not reach you, we will leave a message.  However, if you are feeling well and you are not experiencing any problems, there is no need to return our call.  We will assume that you have returned to your regular daily activities without incident.  SIGNATURES/CONFIDENTIALITY: You and/or your care partner have signed paperwork which will be entered into your electronic medical record.  These signatures attest to the fact that that the information above on your After Visit Summary has been reviewed and is understood.  Full responsibility of the confidentiality of this discharge information lies with you and/or your care-partner.  Please continue your normal medications

## 2016-07-13 NOTE — Op Note (Signed)
[place] Patient Name: Kelly Schneider Procedure Date: 07/13/2016 10:54 AM MRN: [id number] Endoscopist: Gatha Mayer , MD Age: 65 Referring MD:  Date of Birth: 04-08-1951 Gender: Female Account #: [id number] Procedure:                Colonoscopy Indications:              Screening for colorectal malignant neoplasm, Last                            colonoscopy: 2007 Medicines:                Propofol per Anesthesia, Monitored Anesthesia Care Procedure:                Pre-Anesthesia Assessment:                           - Prior to the procedure, a History and Physical                            was performed, and patient medications and                            allergies were reviewed. The patient's tolerance of                            previous anesthesia was also reviewed. The risks                            and benefits of the procedure and the sedation                            options and risks were discussed with the patient.                            All questions were answered, and informed consent                            was obtained. Prior Anticoagulants: The patient has                            taken no previous anticoagulant or antiplatelet                            agents. ASA Grade Assessment: II - A patient with                            mild systemic disease. After reviewing the risks                            and benefits, the patient was deemed in                            satisfactory condition to undergo the procedure.  After obtaining informed consent, the colonoscope                            was passed under direct vision. Throughout the                            procedure, the patient's blood pressure, pulse, and                            oxygen saturations were monitored continuously. The                            Model CF-HQ190L [id number]) scope was introduced                            through the  anus and advanced to the the cecum,                            identified by appendiceal orifice and ileocecal                            valve. The colonoscopy was performed without                            difficulty. The patient tolerated the procedure                            well. The quality of the bowel preparation was                            good. The bowel preparation used was Miralax. The                            ileocecal valve, appendiceal orifice, and rectum                            were photographed. Scope In: 11:04:01 AM Scope Out: 11:17:54 AM Scope Withdrawal Time: 0 hours 9 minutes 26 seconds  Total Procedure Duration: 0 hours 13 minutes 53 seconds  Findings:                 The perianal and digital rectal examinations were                            normal.                           The entire examined colon appeared normal on direct                            and retroflexion views. Complications:            No immediate complications. Estimated Blood Loss:     Estimated blood loss: none. Impression:               - The entire examined colon is normal  on direct and                            retroflexion views.                           - No specimens collected. Recommendation:           - Patient has a contact number available for                            emergencies. The signs and symptoms of potential                            delayed complications were discussed with the                            patient. Return to normal activities tomorrow.                            Written discharge instructions were provided to the                            patient.                           - Resume previous diet.                           - Continue present medications.                           - Repeat colonoscopy/other screening test in 10                            years. Gatha Mayer, MD 07/13/2016 11:23:32 AM This report has been signed  electronically.

## 2016-07-14 ENCOUNTER — Telehealth: Payer: Self-pay

## 2016-07-14 ENCOUNTER — Telehealth: Payer: Self-pay | Admitting: *Deleted

## 2016-07-14 NOTE — Telephone Encounter (Signed)
  Follow up Call-  No answer, left message to call if questions or concerns.    

## 2016-07-14 NOTE — Telephone Encounter (Signed)

## 2016-07-16 ENCOUNTER — Encounter: Payer: Self-pay | Admitting: Internal Medicine

## 2016-07-16 NOTE — Telephone Encounter (Signed)
Error:[id number] ° °

## 2016-07-22 NOTE — Telephone Encounter (Signed)
Opened in error

## 2016-07-23 ENCOUNTER — Ambulatory Visit (INDEPENDENT_AMBULATORY_CARE_PROVIDER_SITE_OTHER): Payer: Medicare Other | Admitting: Internal Medicine

## 2016-07-23 ENCOUNTER — Other Ambulatory Visit: Payer: Self-pay | Admitting: Internal Medicine

## 2016-07-23 ENCOUNTER — Encounter: Payer: Self-pay | Admitting: Internal Medicine

## 2016-07-23 VITALS — BP 122/68 | HR 68 | Temp 98.1°F | Resp 12 | Ht 64.0 in | Wt 147.0 lb

## 2016-07-23 DIAGNOSIS — Z09 Encounter for follow-up examination after completed treatment for conditions other than malignant neoplasm: Secondary | ICD-10-CM | POA: Diagnosis not present

## 2016-07-23 DIAGNOSIS — L570 Actinic keratosis: Secondary | ICD-10-CM

## 2016-07-23 DIAGNOSIS — M8580 Other specified disorders of bone density and structure, unspecified site: Secondary | ICD-10-CM | POA: Diagnosis not present

## 2016-07-23 DIAGNOSIS — Z23 Encounter for immunization: Secondary | ICD-10-CM

## 2016-07-23 DIAGNOSIS — R739 Hyperglycemia, unspecified: Secondary | ICD-10-CM

## 2016-07-23 DIAGNOSIS — Z0000 Encounter for general adult medical examination without abnormal findings: Secondary | ICD-10-CM | POA: Diagnosis not present

## 2016-07-23 NOTE — Progress Notes (Addendum)
Subjective:    Patient ID: Kelly Schneider, female    DOB: [birth year], 65 y.o.   MRN: [id number]  DOS:  07/23/2016 Type of visit - description :  Here for Medicare AWV:  1. Risk factors based on Past M, S, F history: reviewed 2. Physical Activities:  Exercises, free weights, pilates; very active 3. Depression/mood: neg screening ; occ anxiety  4. Hearing:  No problems noted or reported  5. ADL's: independent, drives  6. Fall Risk: no recent falls, prevention discussed , see AVS 7. home Safety: does feel safe at home  8. Height, weight, & visual acuity: see VS, sees eye doctor regulalrly 9. Counseling: provided 10. Labs ordered based on risk factors: if needed  11. Referral Coordination: if needed 12. Care Plan, see assessment and plan , written personalized plan provided , see AVS 13. Cognitive Assessment: motor skills and cognition appropriate for age 65. Care team updated today  15. End-of-life care discussed , has a HC POA  In addition, today we discussed the following: Actinic keratosis: Sees dermatology regularly Dry eye: Good compliance with eye drops Osteopenia: Very active, on calcium, vitamin D. Last bone density test normal.    Review of Systems  Constitutional: No fever. No chills. No unexplained wt changes. No unusual sweats  HEENT: No dental problems, no ear discharge, no facial swelling, no voice changes. No eye discharge, no eye  redness , no  intolerance to light   Respiratory: No wheezing , no  difficulty breathing. No cough , no mucus production  Cardiovascular: No CP, no leg swelling , no  Palpitations  GI: no nausea, no vomiting, no diarrhea , no  abdominal pain.  No blood in the stools. No dysphagia, no odynophagia    Endocrine: No polyphagia, no polyuria , no polydipsia  GU: No dysuria, gross hematuria, difficulty urinating. No urinary urgency, no frequency.  Musculoskeletal: No joint swellings or unusual aches or pains  Skin: No change in  the color of the skin, palor , no  Rash  Allergic, immunologic: No environmental allergies , no  food allergies  Neurological: No dizziness no  syncope. No headaches. No diplopia, no slurred, no slurred speech, no motor deficits, no facial  Numbness  Hematological: No enlarged lymph nodes, no easy bruising , no unusual bleedings  Psychiatry: No suicidal ideas, no hallucinations, no beavior problems, no confusion.  No unusual/severe anxiety, no depression   Past Medical History:  Diagnosis Date  . Actinic keratosis    Right superior central posterior tibial shave, Dr. Fontaine No  . Anxiety    Pt does not like anything in her nose.  . 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 Depression    h/o  . Dry eyes   . Hx of endometriosis   . Hx of migraines   . Ovarian cyst    65 years old  . Ulcer ([place])    65 y o    Past Surgical History:  Procedure Laterality Date  . ABDOMINAL HYSTERECTOMY  1987   Total  . APPENDECTOMY    . CHOLECYSTECTOMY    . LAPAROSCOPY  1990   For Endometriosis  . RIGHT OOPHORECTOMY  1970   right tube removed  . TONSILLECTOMY      Social History   Social History  . Marital status: Married    Spouse name: N/A  . Number of children: 0  . Years of education: N/A   Occupational History  .  retired    Social History Main Topics  . Smoking status: Never Smoker  . Smokeless tobacco: Never Used  . Alcohol use 4.2 oz/week    7 Glasses of wine per week  . Drug use: No  . Sexual activity: Not on file   Other Topics Concern  . Not on file   Social History Narrative   Married w/ Iona Beard         Family History  Problem Relation Age of Onset  . CAD Other     GM, uncle  . Diabetes Other     mother side   . Colon cancer Neg Hx   . Breast cancer Neg Hx        Medication List       Accurate as of 07/23/16 11:59 PM. Always use your most recent med list.          aspirin EC 81 MG tablet Take 81  mg by mouth daily.   cycloSPORINE 0.05 % ophthalmic emulsion Commonly known as:  RESTASIS Place 1 drop into both eyes 2 (two) times daily.   estradiol 1 MG tablet Commonly known as:  ESTRACE Take 1 mg by mouth daily.   ONE-A-DAY 50 PLUS PO Take by mouth daily.   POTASSIMIN PO Take 550 mg by mouth daily.   THERA TEARS NUTRITION PO Take by mouth. Take 3 capsules daily   VIACTIV PO Take by mouth. Viactive chews-Take 1 pill bid          Objective:   Physical Exam BP 122/68 (BP Location: Left Arm, Patient Position: Sitting, Cuff Size: Small)   Pulse 68   Temp 98.1 F (36.7 C) (Oral)   Resp 12   Ht 5\' 4"  (1.626 m)   Wt 147 lb (66.7 kg)   SpO2 98%   BMI 25.23 kg/m   General:   Well developed, well nourished . NAD.  Neck: No  thyromegaly  HEENT:  Normocephalic . Face symmetric, atraumatic Lungs:  CTA B Normal respiratory effort, no intercostal retractions, no accessory muscle use. Heart: RRR,  no murmur.  No pretibial edema bilaterally  Abdomen:  Not distended, soft, non-tender. No rebound or rigidity.   Skin: Exposed areas without rash. Not pale. Not jaundice Neurologic:  alert & oriented X3.  Speech normal, gait appropriate for age and unassisted Strength symmetric and appropriate for age.  Psych: Cognition and judgment appear intact.  Cooperative with normal attention span and concentration.  Behavior appropriate. No anxious or depressed appearing.    Assessment & Plan:   Assessment H/o Anxiety depression Osteopenia: DEXA normal 2016 Actinic keratosis History of migraines Dry eyes  Plan: Osteopenia: Normal dexa  2016 Actinic keratosis: Sees dermatology regularly At some point she had a slightly elevated blood sugar, check a FLP, A1c, TSH. Continue with healthy lifestyle RTC one year, CPX

## 2016-07-23 NOTE — Progress Notes (Signed)
Pre visit review using our clinic review tool, if applicable. No additional management support is needed unless otherwise documented below in the visit note. 

## 2016-07-23 NOTE — Assessment & Plan Note (Signed)
Td 2016; prevnar 2016; pnm 23 -07-2016 Zoster 2011 Flu shot today  CCS: last cscope 06-2016 (-), Dr Carlean Purl , 65 years Female care-- per gyn Dr Philis Pique, has a f/u at the end of the month, will get a MMG then.  DEXA wnl 04-2015 [place] (-) AAA 04-2015 doing great with lifestyle

## 2016-07-23 NOTE — Patient Instructions (Signed)
Get your blood work before you leave   Next visit 1 year    Fall Prevention and [place] cause injuries and can affect all age groups. It is possible to use preventive measures to significantly decrease the likelihood of falls. There are many simple measures which can make your home safer and prevent falls. OUTDOORS  Repair cracks and edges of walkways and driveways.  Remove high doorway thresholds.  Trim shrubbery on the main path into your home.  Have good outside lighting.  Clear walkways of tools, rocks, debris, and clutter.  Check that handrails are not broken and are securely fastened. Both sides of steps should have handrails.  Have leaves, snow, and ice cleared regularly.  Use sand or salt on walkways during winter months.  In the garage, clean up grease or oil spills. BATHROOM  Install night lights.  Install grab bars by the toilet and in the tub and shower.  Use non-skid mats or decals in the tub or shower.  Place a plastic non-slip stool in the shower to sit on, if needed.  Keep floors dry and clean up all water on the floor immediately.  Remove soap buildup in the tub or shower on a regular basis.  Secure bath mats with non-slip, double-sided rug tape.  Remove throw rugs and tripping hazards from the floors. BEDROOMS  Install night lights.  Make sure a bedside light is easy to reach.  Do not use oversized bedding.  Keep a telephone by your bedside.  Have a firm chair with side arms to use for getting dressed.  Remove throw rugs and tripping hazards from the floor. KITCHEN  Keep handles on pots and pans turned toward the center of the stove. Use back burners when possible.  Clean up spills quickly and allow time for drying.  Avoid walking on wet floors.  Avoid hot utensils and knives.  Position shelves so they are not too high or low.  Place commonly used objects within easy reach.  If necessary, use a sturdy step stool with a  grab bar when reaching.  Keep electrical cables out of the way.  Do not use floor polish or wax that makes floors slippery. If you must use wax, use non-skid floor wax.  Remove throw rugs and tripping hazards from the floor. STAIRWAYS  Never leave objects on stairs.  Place handrails on both sides of stairways and use them. Fix any loose handrails. Make sure handrails on both sides of the stairways are as long as the stairs.  Check carpeting to make sure it is firmly attached along stairs. Make repairs to worn or loose carpet promptly.  Avoid placing throw rugs at the top or bottom of stairways, or properly secure the rug with carpet tape to prevent slippage. Get rid of throw rugs, if possible.  Have an electrician put in a light switch at the top and bottom of the stairs. OTHER FALL PREVENTION TIPS  Wear low-heel or rubber-soled shoes that are supportive and fit well. Wear closed toe shoes.  When using a stepladder, make sure it is fully opened and both spreaders are firmly locked. Do not climb a closed stepladder.  Add color or contrast paint or tape to grab bars and handrails in your home. Place contrasting color strips on first and last steps.  Learn and use mobility aids as needed. Install an electrical emergency response system.  Turn on lights to avoid dark areas. Replace light bulbs that burn out immediately. Get light  Get light switches that glow.  Arrange furniture to create clear pathways. Keep furniture in the same place.  Firmly attach carpet with non-skid or double-sided tape.  Eliminate uneven floor surfaces.  Select a carpet pattern that does not visually hide the edge of steps.  Be aware of all pets. OTHER HOME SAFETY TIPS  Set the water temperature for 120 F (48.8 C).  Keep emergency numbers on or near the telephone.  Keep smoke detectors on every level of the home and near sleeping areas. Document Released: 08/20/2002 Document Revised: 02/29/2012  Document Reviewed: 11/19/2011 ExitCare Patient Information 2015 [employer]. This information is not intended to replace advice given to you by your health care provider. Make sure you discuss any questions you have with your health care provider.   Preventive Care for Adults Ages 65 and over  Blood pressure check.** / Every 1 to 2 years.  Lipid and cholesterol check.**/ Every 5 years beginning at age 20.  Lung cancer screening. / Every year if you are aged 55-80 years and have a 30-pack-year history of smoking and currently smoke or have quit within the past 15 years. Yearly screening is stopped once you have quit smoking for at least 15 years or develop a health problem that would prevent you from having lung cancer treatment.  Fecal occult blood test (FOBT) of stool. / Every year beginning at age 50 and continuing until age 75. You may not have to do this test if you get a colonoscopy every 10 years.  Flexible sigmoidoscopy** or colonoscopy.** / Every 5 years for a flexible sigmoidoscopy or every 10 years for a colonoscopy beginning at age 50 and continuing until age 75.  Hepatitis C blood test.** / For all people born from 1945 through 1965 and any individual with known risks for hepatitis C.  Abdominal aortic aneurysm (AAA) screening.** / A one-time screening for ages 65 to 75 years who are current or former smokers.  Skin self-exam. / Monthly.  Influenza vaccine. / Every year.  Tetanus, diphtheria, and acellular pertussis (Tdap/Td) vaccine.** / 1 dose of Td every 10 years.  Varicella vaccine.** / Consult your health care provider.  Zoster vaccine.** / 1 dose for adults aged 60 years or older.  Pneumococcal 13-valent conjugate (PCV13) vaccine.** / Consult your health care provider.  Pneumococcal polysaccharide (PPSV23) vaccine.** / 1 dose for all adults aged 65 years and older.  Meningococcal vaccine.** / Consult your health care provider.  Hepatitis A vaccine.** /  Consult your health care provider.  Hepatitis B vaccine.** / Consult your health care provider.  Haemophilus influenzae type b (Hib) vaccine.** / Consult your health care provider. **Family history and personal history of risk and conditions may change your health care provider's recommendations. Document Released: 10/26/2001 Document Revised: 09/04/2013 Document Reviewed: 01/25/2011 ExitCare Patient Information 2015 [employer]. This information is not intended to replace advice given to you by your health care provider. Make sure you discuss any questions you have with your health care provider.   

## 2016-07-24 LAB — HEMOGLOBIN A1C
Hgb A1c MFr Bld: 5.1 % (ref <5.7)
Mean Plasma Glucose: 100 mg/dL

## 2016-07-24 LAB — LIPID PANEL
CHOLESTEROL: 224 mg/dL — AB (ref <200)
HDL: 113 mg/dL (ref >50)
LDL CALC: 90 mg/dL (ref <100)
TRIGLYCERIDES: 107 mg/dL (ref <150)
Total CHOL/HDL Ratio: 2 Ratio (ref <5.0)
VLDL: 21 mg/dL (ref <30)

## 2016-07-25 DIAGNOSIS — Z09 Encounter for follow-up examination after completed treatment for conditions other than malignant neoplasm: Secondary | ICD-10-CM | POA: Insufficient documentation

## 2016-07-25 DIAGNOSIS — L570 Actinic keratosis: Secondary | ICD-10-CM | POA: Insufficient documentation

## 2016-07-25 NOTE — Assessment & Plan Note (Addendum)
Osteopenia: Normal dexa 2016 Actinic keratosis: Sees dermatology regularly At some point she had a slightly elevated blood sugar, check a FLP, A1c, TSH. Continue with healthy lifestyle RTC one year, CPX

## 2016-07-26 LAB — TSH: TSH: 1.48 m[IU]/L

## 2016-08-03 ENCOUNTER — Other Ambulatory Visit (INDEPENDENT_AMBULATORY_CARE_PROVIDER_SITE_OTHER): Payer: Medicare Other

## 2016-08-03 ENCOUNTER — Telehealth: Payer: Self-pay | Admitting: Internal Medicine

## 2016-08-03 DIAGNOSIS — Z1159 Encounter for screening for other viral diseases: Secondary | ICD-10-CM

## 2016-08-03 DIAGNOSIS — R32 Unspecified urinary incontinence: Secondary | ICD-10-CM | POA: Diagnosis not present

## 2016-08-03 DIAGNOSIS — Z1231 Encounter for screening mammogram for malignant neoplasm of breast: Secondary | ICD-10-CM | POA: Diagnosis not present

## 2016-08-03 DIAGNOSIS — N951 Menopausal and female climacteric states: Secondary | ICD-10-CM | POA: Diagnosis not present

## 2016-08-03 LAB — HM MAMMOGRAPHY

## 2016-08-03 LAB — HM PAP SMEAR

## 2016-08-03 NOTE — Telephone Encounter (Signed)
Hep C ordered. Recommend Pt call to discuss with her insurance first, some insurances are not covering screening.

## 2016-08-03 NOTE — Telephone Encounter (Signed)
Informed patient that she should check with her insurance before coming in to make sure it will be covered. Patient stated she will but still wanted the test even if they didn't cover it. Lab appointment scheduled for 08/16/16

## 2016-08-03 NOTE — Telephone Encounter (Signed)
Patient would like to know what she needs to do in order to get her hepatitis screening. Please advise   Patient phone: [phone]

## 2016-08-04 ENCOUNTER — Ambulatory Visit (INDEPENDENT_AMBULATORY_CARE_PROVIDER_SITE_OTHER): Payer: Medicare Other | Admitting: Internal Medicine

## 2016-08-04 ENCOUNTER — Encounter: Payer: Self-pay | Admitting: Internal Medicine

## 2016-08-04 VITALS — BP 132/78 | HR 76 | Temp 97.8°F | Resp 12 | Ht 64.0 in | Wt 148.5 lb

## 2016-08-04 DIAGNOSIS — R030 Elevated blood-pressure reading, without diagnosis of hypertension: Secondary | ICD-10-CM

## 2016-08-04 LAB — HEPATITIS C ANTIBODY: HCV AB: NEGATIVE

## 2016-08-04 NOTE — Progress Notes (Signed)
Subjective:    Patient ID: Kelly Schneider, female    DOB: 06-23-1951, 65 y.o.   MRN: [id number]  DOS:  08/04/2016 Type of visit - description : BP problems Interval history:  Yesterday went to her gynecologist, she was to have a Pap smear and a mammogram and BP was elevated >>>  188/92, 178/86, 178/78, 162/82 Then she went home, husband checked several times and it gradually went from 150/78 to 121/74. This morning was in the 120s.  Review of Systems She never had symptoms. No headache, chest pain, difficulty breathing. No excessive salt intake or NSAIDs  Past Medical History:  Diagnosis Date  . Actinic keratosis    Right superior central posterior tibial shave, Dr. Fontaine No  . Anxiety    Pt does not like anything in her nose.  . 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 Depression    h/o  . Dry eyes   . Hx of endometriosis   . Hx of migraines   . Ovarian cyst    65 years old  . Ulcer ([place])    82 y o    Past Surgical History:  Procedure Laterality Date  . ABDOMINAL HYSTERECTOMY  1987   Total  . APPENDECTOMY    . CHOLECYSTECTOMY    . LAPAROSCOPY  1990   For Endometriosis  . RIGHT OOPHORECTOMY  1970   right tube removed  . TONSILLECTOMY      Social History   Social History  . Marital status: Married    Spouse name: N/A  . Number of children: 0  . Years of education: N/A   Occupational History  . retired    Social History Main Topics  . Smoking status: Never Smoker  . Smokeless tobacco: Never Used  . Alcohol use 4.2 oz/week    7 Glasses of wine per week  . Drug use: No  . Sexual activity: Not on file   Other Topics Concern  . Not on file   Social History Narrative   Married w/ Iona Beard            Medication List       Accurate as of 08/04/16 11:59 PM. Always use your most recent med list.          aspirin EC 81 MG tablet Take 81 mg by mouth daily.   cycloSPORINE 0.05 % ophthalmic  emulsion Commonly known as:  RESTASIS Place 1 drop into both eyes 2 (two) times daily.   estradiol 1 MG tablet Commonly known as:  ESTRACE Take 1 mg by mouth daily.   ONE-A-DAY 50 PLUS PO Take by mouth daily.   POTASSIMIN PO Take 550 mg by mouth daily.   THERA TEARS NUTRITION PO Take by mouth. Take 3 capsules daily   VIACTIV PO Take by mouth. Viactive chews-Take 1 pill bid          Objective:   Physical Exam BP 132/78 (BP Location: Left Arm, Patient Position: Sitting, Cuff Size: Small)   Pulse 76   Temp 97.8 F (36.6 C) (Oral)   Resp 12   Ht 5\' 4"  (1.626 m)   Wt 148 lb 8 oz (67.4 kg)   SpO2 97%   BMI 25.49 kg/m  General:   Well developed, well nourished . NAD.  HEENT:  Normocephalic . Face symmetric, atraumatic Lungs:  CTA B Normal respiratory effort, no intercostal retractions, no accessory muscle use. Heart: RRR,  no murmur.  No  pretibial edema bilaterally  Skin: Not pale. Not jaundice Neurologic:  alert & oriented X3.  Speech normal, gait appropriate for age and unassisted Psych--  Cognition and judgment appear intact.  Cooperative with normal attention span and concentration.  Behavior appropriate. No anxious or depressed appearing.      Assessment & Plan:   Assessment H/o Anxiety depression Osteopenia: DEXA normal 2016 Actinic keratosis History of migraines Dry eyes  Plan: Elevated BP without diagnosis of hypertension: BP was  Elevated yesterday, related to apprehension about having a mammogram?.  Her BP has been otherwise normal. For now I recommend to continue with her healthy lifestyle, check BPs daily, call if problems, see AVS.  Pt's husband is quite concerned about her BP, I tried to reassure her, if she continued having excursions  of high blood pressure will start treatment.

## 2016-08-04 NOTE — Patient Instructions (Signed)
Check the  blood pressure daily  Be sure your blood pressure is between 110/65 and  145/85.  if it is consistently higher or lower, let me know   If more than 190 : call or go to the ER     Hypertension Hypertension, commonly called high blood pressure, is when the force of blood pumping through your arteries is too strong. Your arteries are the blood vessels that carry blood from your heart throughout your body. A blood pressure reading consists of a higher number over a lower number, such as 110/72. The higher number (systolic) is the pressure inside your arteries when your heart pumps. The lower number (diastolic) is the pressure inside your arteries when your heart relaxes. Ideally you want your blood pressure below 120/80. Hypertension forces your heart to work harder to pump blood. Your arteries may become narrow or stiff. Having untreated or uncontrolled hypertension can cause heart attack, stroke, kidney disease, and other problems. What increases the risk? Some risk factors for high blood pressure are controllable. Others are not. Risk factors you cannot control include:  Race. You may be at higher risk if you are African American.  Age. Risk increases with age.  Gender. Men are at higher risk than women before age 62 years. After age 65, women are at higher risk than men. Risk factors you can control include:  Not getting enough exercise or physical activity.  Being overweight.  Getting too much fat, sugar, calories, or salt in your diet.  Drinking too much alcohol. What are the signs or symptoms? Hypertension does not usually cause signs or symptoms. Extremely high blood pressure (hypertensive crisis) may cause headache, anxiety, shortness of breath, and nosebleed. How is this diagnosed? To check if you have hypertension, your health care provider will measure your blood pressure while you are seated, with your arm held at the level of your heart. It should be measured at  least twice using the same arm. Certain conditions can cause a difference in blood pressure between your right and left arms. A blood pressure reading that is higher than normal on one occasion does not mean that you need treatment. If it is not clear whether you have high blood pressure, you may be asked to return on a different day to have your blood pressure checked again. Or, you may be asked to monitor your blood pressure at home for 1 or more weeks. How is this treated? Treating high blood pressure includes making lifestyle changes and possibly taking medicine. Living a healthy lifestyle can help lower high blood pressure. You may need to change some of your habits. Lifestyle changes may include:  Following the DASH diet. This diet is high in fruits, vegetables, and whole grains. It is low in salt, red meat, and added sugars.  Keep your sodium intake below 2,300 mg per day.  Getting at least 30-45 minutes of aerobic exercise at least 4 times per week.  Losing weight if necessary.  Not smoking.  Limiting alcoholic beverages.  Learning ways to reduce stress. Your health care provider may prescribe medicine if lifestyle changes are not enough to get your blood pressure under control, and if one of the following is true:  You are 73-43 years of age and your systolic blood pressure is above 140.  You are 69 years of age or older, and your systolic blood pressure is above 150.  Your diastolic blood pressure is above 90.  You have diabetes, and your systolic blood pressure  is over [id number] or your diastolic blood pressure is over 90.  You have kidney disease and your blood pressure is above 140/90.  You have heart disease and your blood pressure is above 140/90. Your personal target blood pressure may vary depending on your medical conditions, your age, and other factors. Follow these instructions at home:  Have your blood pressure rechecked as directed by your health care  provider.  Take medicines only as directed by your health care provider. Follow the directions carefully. Blood pressure medicines must be taken as prescribed. The medicine does not work as well when you skip doses. Skipping doses also puts you at risk for problems.  Do not smoke.  Monitor your blood pressure at home as directed by your health care provider. Contact a health care provider if:  You think you are having a reaction to medicines taken.  You have recurrent headaches or feel dizzy.  You have swelling in your ankles.  You have trouble with your vision. Get help right away if:  You develop a severe headache or confusion.  You have unusual weakness, numbness, or feel faint.  You have severe chest or abdominal pain.  You vomit repeatedly.  You have trouble breathing. This information is not intended to replace advice given to you by your health care provider. Make sure you discuss any questions you have with your health care provider. Document Released: 08/30/2005 Document Revised: 02/05/2016 Document Reviewed: 06/22/2013 Elsevier Interactive Patient Education  2017 [employer].

## 2016-08-04 NOTE — Progress Notes (Signed)
Pre visit review using our clinic review tool, if applicable. No additional management support is needed unless otherwise documented below in the visit note. 

## 2016-08-06 NOTE — Assessment & Plan Note (Signed)
Plan: Elevated BP without diagnosis of hypertension: BP was  Elevated yesterday, related to apprehension about having a mammogram?.  Her BP has been otherwise normal. For now I recommend to continue with her healthy lifestyle, check BPs daily, call if problems, see AVS.  Pt's husband is quite concerned about her BP, I tried to reassure her, if she continued having excursions  of high blood pressure will start treatment.

## 2016-08-16 ENCOUNTER — Other Ambulatory Visit: Payer: Medicare Other

## 2016-08-18 ENCOUNTER — Encounter: Payer: Self-pay | Admitting: Internal Medicine

## 2016-10-08 DIAGNOSIS — H52223 Regular astigmatism, bilateral: Secondary | ICD-10-CM | POA: Diagnosis not present

## 2016-10-08 DIAGNOSIS — H2513 Age-related nuclear cataract, bilateral: Secondary | ICD-10-CM | POA: Diagnosis not present

## 2016-10-08 DIAGNOSIS — H524 Presbyopia: Secondary | ICD-10-CM | POA: Diagnosis not present

## 2016-10-08 DIAGNOSIS — H04123 Dry eye syndrome of bilateral lacrimal glands: Secondary | ICD-10-CM | POA: Diagnosis not present

## 2016-10-08 DIAGNOSIS — H5213 Myopia, bilateral: Secondary | ICD-10-CM | POA: Diagnosis not present

## 2017-05-06 ENCOUNTER — Encounter: Payer: Self-pay | Admitting: Family Medicine

## 2017-05-06 ENCOUNTER — Ambulatory Visit (INDEPENDENT_AMBULATORY_CARE_PROVIDER_SITE_OTHER): Payer: Medicare Other | Admitting: Family Medicine

## 2017-05-06 ENCOUNTER — Telehealth: Payer: Self-pay | Admitting: Internal Medicine

## 2017-05-06 VITALS — BP 108/62 | HR 82 | Temp 98.0°F | Ht 64.0 in | Wt 148.0 lb

## 2017-05-06 DIAGNOSIS — S6990XA Unspecified injury of unspecified wrist, hand and finger(s), initial encounter: Secondary | ICD-10-CM

## 2017-05-06 NOTE — Patient Instructions (Signed)
Do not shower for the rest of the day. When you do wash it, use only soap and water. Do not vigorously scrub. Apply triple antibiotic ointment (like Neosporin) twice daily. Keep the area clean and dry.   Things to look out for: increasing pain not relieved by ibuprofen/acetaminophen, fevers, spreading redness, drainage of pus, or foul odor.  OK to delegate yard work and chores.

## 2017-05-06 NOTE — Telephone Encounter (Signed)
Pt came in office and dropped off a small note for her provider to have (a small note regarding about her sprain ankle. Note put at front office tray.

## 2017-05-06 NOTE — Progress Notes (Signed)
Pre visit review using our clinic review tool, if applicable. No additional management support is needed unless otherwise documented below in the visit note. 

## 2017-05-06 NOTE — Progress Notes (Signed)
Chief Complaint  Patient presents with  . Lip Laceration    left index finger   Patient is a 66 year old female presents with a cut of her left index finger. She was cutting and sliced off her fingernail and a piece of flesh. It is painful. Her last tetanus shot was in 2016. She denies any numbness, tingling, fevers, or spreading/streaking redness. She does not feel any foreign body is present.  ROS: Skin: +cut on finger  Past Medical History:  Diagnosis Date  . Actinic keratosis   . Anxiety   . Blood transfusion without reported diagnosis   . Depression   . Dry eyes   . Hx of endometriosis   . Hx of migraines   . Ovarian cyst   . Ulcer     BP 108/62 (BP Location: Left Arm, Patient Position: Sitting, Cuff Size: Normal)   Pulse 82   Temp 98 F (36.7 C) (Oral)   Ht 5\' 4"  (1.626 m)   Wt 148 lb (67.1 kg)   SpO2 97%   BMI 25.40 kg/m  Gen- awake, alert Heart- brisk cap refil Lungs- nml effort Skin- lateral distal portion of nail missing with erosion of subungual epidermis. Serous drainage. Dried blood. No gapping. There is a laceration that is well approximated over the distal tip of finger, likely from nail removal.  Psych- Age appropriate judgment and insight  Finger injury, initial encounter  Aftercare instructions verbalized and written. Ice. NSAIDs. Tylenol. Keep area clean and dry. TAO. F/u prn. The patient voiced understanding and agreement with plan.  [place], [place] 4:07 PM 05/06/17

## 2017-07-01 ENCOUNTER — Telehealth: Payer: Self-pay | Admitting: Internal Medicine

## 2017-07-01 NOTE — Telephone Encounter (Signed)
Called Mrs. Sweeny, spoke with pt's husband. He stated he would have Mrs. Thomaston give the office a call to schedule appt.   *Last AWV 07/23/2016. SF

## 2017-08-16 ENCOUNTER — Encounter: Payer: Self-pay | Admitting: Internal Medicine

## 2017-08-16 DIAGNOSIS — Z23 Encounter for immunization: Secondary | ICD-10-CM | POA: Diagnosis not present

## 2017-10-18 DIAGNOSIS — H524 Presbyopia: Secondary | ICD-10-CM | POA: Diagnosis not present

## 2017-10-18 DIAGNOSIS — H5213 Myopia, bilateral: Secondary | ICD-10-CM | POA: Diagnosis not present

## 2017-10-18 DIAGNOSIS — H04123 Dry eye syndrome of bilateral lacrimal glands: Secondary | ICD-10-CM | POA: Diagnosis not present

## 2017-10-18 DIAGNOSIS — H2513 Age-related nuclear cataract, bilateral: Secondary | ICD-10-CM | POA: Diagnosis not present

## 2017-10-18 DIAGNOSIS — H52223 Regular astigmatism, bilateral: Secondary | ICD-10-CM | POA: Diagnosis not present

## 2018-03-21 ENCOUNTER — Ambulatory Visit: Payer: Medicare Other | Admitting: Internal Medicine

## 2018-04-12 ENCOUNTER — Other Ambulatory Visit: Payer: Self-pay

## 2018-07-01 DIAGNOSIS — Z23 Encounter for immunization: Secondary | ICD-10-CM | POA: Diagnosis not present

## 2018-08-02 ENCOUNTER — Other Ambulatory Visit: Payer: Self-pay

## 2019-04-10 ENCOUNTER — Encounter: Payer: Self-pay | Admitting: Internal Medicine

## 2019-05-25 DIAGNOSIS — H524 Presbyopia: Secondary | ICD-10-CM | POA: Diagnosis not present

## 2019-05-25 DIAGNOSIS — H52223 Regular astigmatism, bilateral: Secondary | ICD-10-CM | POA: Diagnosis not present

## 2019-05-25 DIAGNOSIS — H04123 Dry eye syndrome of bilateral lacrimal glands: Secondary | ICD-10-CM | POA: Diagnosis not present

## 2019-05-25 DIAGNOSIS — H2513 Age-related nuclear cataract, bilateral: Secondary | ICD-10-CM | POA: Diagnosis not present

## 2019-05-25 DIAGNOSIS — H5213 Myopia, bilateral: Secondary | ICD-10-CM | POA: Diagnosis not present

## 2019-06-01 DIAGNOSIS — Z23 Encounter for immunization: Secondary | ICD-10-CM | POA: Diagnosis not present

## 2019-07-24 DIAGNOSIS — Z1231 Encounter for screening mammogram for malignant neoplasm of breast: Secondary | ICD-10-CM | POA: Diagnosis not present

## 2019-07-24 DIAGNOSIS — Z01419 Encounter for gynecological examination (general) (routine) without abnormal findings: Secondary | ICD-10-CM | POA: Diagnosis not present

## 2019-07-24 DIAGNOSIS — Z1272 Encounter for screening for malignant neoplasm of vagina: Secondary | ICD-10-CM | POA: Diagnosis not present

## 2019-07-24 LAB — HM MAMMOGRAPHY

## 2019-07-24 LAB — HM PAP SMEAR

## 2019-08-13 ENCOUNTER — Encounter: Payer: Self-pay | Admitting: Internal Medicine

## 2019-08-20 ENCOUNTER — Other Ambulatory Visit: Payer: Self-pay

## 2019-08-21 ENCOUNTER — Ambulatory Visit (HOSPITAL_BASED_OUTPATIENT_CLINIC_OR_DEPARTMENT_OTHER)
Admission: RE | Admit: 2019-08-21 | Discharge: 2019-08-21 | Disposition: A | Discharge status: Home / Self Care | Payer: Medicare Other | Source: Ambulatory Visit | Attending: Internal Medicine | Admitting: Internal Medicine

## 2019-08-21 ENCOUNTER — Encounter: Payer: Self-pay | Admitting: Internal Medicine

## 2019-08-21 ENCOUNTER — Ambulatory Visit (INDEPENDENT_AMBULATORY_CARE_PROVIDER_SITE_OTHER): Payer: Medicare Other | Admitting: Internal Medicine

## 2019-08-21 VITALS — BP 167/89 | HR 94 | Temp 96.5°F | Resp 16 | Ht 64.0 in | Wt 164.5 lb

## 2019-08-21 DIAGNOSIS — R0600 Dyspnea, unspecified: Secondary | ICD-10-CM

## 2019-08-21 DIAGNOSIS — I10 Essential (primary) hypertension: Secondary | ICD-10-CM | POA: Diagnosis not present

## 2019-08-21 DIAGNOSIS — R0602 Shortness of breath: Secondary | ICD-10-CM | POA: Diagnosis not present

## 2019-08-21 DIAGNOSIS — R0609 Other forms of dyspnea: Secondary | ICD-10-CM

## 2019-08-21 LAB — LIPID PANEL
Cholesterol: 226 mg/dL — ABNORMAL HIGH (ref 0–200)
HDL: 97.2 mg/dL (ref >39.00)
LDL Cholesterol: 106 mg/dL — ABNORMAL HIGH (ref 0–99)
NonHDL: 128.92
Total CHOL/HDL Ratio: 2
Triglycerides: 116 mg/dL (ref 0.0–149.0)
VLDL: 23.2 mg/dL (ref 0.0–40.0)

## 2019-08-21 LAB — COMPREHENSIVE METABOLIC PANEL
ALT: 26 U/L (ref 0–35)
AST: 27 U/L (ref 0–37)
Albumin: 4.3 g/dL (ref 3.5–5.2)
Alkaline Phosphatase: 79 U/L (ref 39–117)
BUN: 14 mg/dL (ref 6–23)
CO2: 28 mEq/L (ref 19–32)
Calcium: 9.4 mg/dL (ref 8.4–10.5)
Chloride: 102 mEq/L (ref 96–112)
Creatinine, Ser: 0.81 mg/dL (ref 0.40–1.20)
GFR: 69.72 mL/min (ref >60.00)
Glucose, Bld: 92 mg/dL (ref 70–99)
Potassium: 4.3 mEq/L (ref 3.5–5.1)
Sodium: 138 mEq/L (ref 135–145)
Total Bilirubin: 0.5 mg/dL (ref 0.2–1.2)
Total Protein: 6.6 g/dL (ref 6.0–8.3)

## 2019-08-21 LAB — CBC WITH DIFFERENTIAL/PLATELET
Basophils Absolute: 0.1 10*3/uL (ref 0.0–0.1)
Basophils Relative: 1.2 % (ref 0.0–3.0)
Eosinophils Absolute: 0.2 10*3/uL (ref 0.0–0.7)
Eosinophils Relative: 2.5 % (ref 0.0–5.0)
HCT: 41.8 % (ref 36.0–46.0)
Hemoglobin: 14 g/dL (ref 12.0–15.0)
Lymphocytes Relative: 31.4 % (ref 12.0–46.0)
Lymphs Abs: 1.9 10*3/uL (ref 0.7–4.0)
MCHC: 33.5 g/dL (ref 30.0–36.0)
MCV: 88.3 fl (ref 78.0–100.0)
Monocytes Absolute: 0.6 10*3/uL (ref 0.1–1.0)
Monocytes Relative: 9.8 % (ref 3.0–12.0)
Neutro Abs: 3.4 10*3/uL (ref 1.4–7.7)
Neutrophils Relative %: 55.1 % (ref 43.0–77.0)
Platelets: 279 10*3/uL (ref 150.0–400.0)
RBC: 4.74 Mil/uL (ref 3.87–5.11)
RDW: 13.8 % (ref 11.5–15.5)
WBC: 6.2 10*3/uL (ref 4.0–10.5)

## 2019-08-21 LAB — TSH: TSH: 2.58 u[IU]/mL (ref 0.35–4.50)

## 2019-08-21 MED ORDER — AMLODIPINE BESYLATE 5 MG PO TABS: 5.0000 mg | ORAL_TABLET | Freq: Every day | ORAL | 3 refills | Status: DC

## 2019-08-21 NOTE — Progress Notes (Signed)
Pre visit review using our clinic review tool, if applicable. No additional management support is needed unless otherwise documented below in the visit note. 

## 2019-08-21 NOTE — Patient Instructions (Addendum)
Please schedule Medicare Wellness with Glenard Haring.  GO TO THE LAB : Get the blood work     GO TO THE FRONT DESK Schedule your next appointment   for a checkup in 4 weeks    Get a chest x-ray downstairs   Start amlodipine 5 mg 1 tablet daily for hypertension  Watch her salt intake  Check the  blood pressure 2 or 3 times a week BP GOAL is between 110/65 and  135/85. If it is consistently higher or lower, let me know

## 2019-08-21 NOTE — Progress Notes (Signed)
Subjective:    Patient ID: Kelly Schneider, female    DOB: 12/15/1950, 68 y.o.   MRN: [id number]  DOS:  08/21/2019 Type of visit - description: f/u Elevated BP: Blood pressure was elevated at gynecology and is sometimes elevated at home. DOE: Reports mild difficulty breathing, typically at home when she is doing ADLs. Symptoms started this year, no associated nausea, chest pain, diaphoresis.  Occasionally she has palpitations. She is however able to exercise on her bike without problems  Sometimes had noted wheezing at night, denies any history of asthma.  No recent lower extremity edema or weight gain.  Occasionally has cough early in the morning temporarily.  Has bladder incontinence, was referred to urology by her gynecologist  Aspirin?  Reports she stopped taking it because she would bleed very easily with scratches.  Denies gross hematuria, blood in the stools.   Review of Systems  As above  No nausea, vomiting, diarrhea.  No blood in the stools  Past Medical History:  Diagnosis Date  . Actinic keratosis    Right superior central posterior tibial shave, Dr. Fontaine No  . Anxiety    Pt does not like anything in her nose.  . 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 Depression    h/o  . Dry eyes   . Hx of endometriosis   . Hx of migraines   . Ovarian cyst    68 years old  . Ulcer    15 y o    Past Surgical History:  Procedure Laterality Date  . ABDOMINAL HYSTERECTOMY  1987   Total  . APPENDECTOMY    . CHOLECYSTECTOMY    . LAPAROSCOPY  1990   For Endometriosis  . RIGHT OOPHORECTOMY  1970   right tube removed  . TONSILLECTOMY      Social History   Socioeconomic History  . Marital status: Married    Spouse name: Not on file  . Number of children: 0  . Years of education: Not on file  . Highest education level: Not on file  Occupational History  . Occupation: retired  Scientific laboratory technician  . Financial resource  strain: Not on file  . Food insecurity    Worry: Not on file    Inability: Not on file  . Transportation needs    Medical: Not on file    Non-medical: Not on file  Tobacco Use  . Smoking status: Never Smoker  . Smokeless tobacco: Never Used  Substance and Sexual Activity  . Alcohol use: Yes    Alcohol/week: 7.0 standard drinks    Types: 7 Glasses of wine per week  . Drug use: No  . Sexual activity: Not on file  Lifestyle  . Physical activity    Days per week: Not on file    Minutes per session: Not on file  . Stress: Not on file  Relationships  . Social Herbalist on phone: Not on file    Gets together: Not on file    Attends religious service: Not on file    Active member of club or organization: Not on file    Attends meetings of clubs or organizations: Not on file    Relationship status: Not on file  . Intimate partner violence    Fear of current or ex partner: Not on file    Emotionally abused: Not on file    Physically abused: Not on file    Forced  sexual activity: Not on file  Other Topics Concern  . Not on file  Social History Narrative   Married w/ Iona Beard       Allergies as of 08/21/2019      Reactions   Penicillins    REACTION: nausea      Medication List       Accurate as of August 21, 2019  1:09 PM. If you have any questions, ask your nurse or doctor.        aspirin EC 81 MG tablet Take 81 mg by mouth daily.   cycloSPORINE 0.05 % ophthalmic emulsion Commonly known as: RESTASIS Place 1 drop into both eyes 2 (two) times daily.   estradiol 1 MG tablet Commonly known as: ESTRACE Take 1 mg by mouth daily.   ONE-A-DAY 50 PLUS PO Take by mouth daily.   POTASSIMIN PO Take 550 mg by mouth daily.   THERA TEARS NUTRITION PO Take by mouth. Take 2 capsules daily   TURMERIC PO Take 1 tablet by mouth daily.   VIACTIV PO Take by mouth. Viactive chews-Take 1 pill bid   VITAMIN C PO Take 375 mg by mouth daily.            Objective:   Physical Exam BP (!) 167/89 (BP Location: Left Arm, Patient Position: Sitting, Cuff Size: Small)   Pulse 94   Temp (!) 96.5 F (35.8 C) (Temporal)   Resp 16   Ht 5\' 4"  (1.626 m)   Wt 164 lb 8 oz (74.6 kg)   SpO2 97%   BMI 28.24 kg/m  General:   Well developed, NAD, BMI noted.  HEENT:  Normocephalic . Face symmetric, atraumatic Neck: No thyromegaly Lungs:  CTA B Normal respiratory effort, no intercostal retractions, no accessory muscle use. Heart: RRR,  no murmur.  no pretibial edema bilaterally  Abdomen:  Not distended, soft, non-tender. No rebound or rigidity.   Skin: Not pale. Not jaundice Neurologic:  alert & oriented X3.  Speech normal, gait appropriate for age and unassisted Psych--  Cognition and judgment appear intact.  Cooperative with normal attention span and concentration.  Behavior appropriate. No anxious or depressed appearing.     Assessment     Assessment H/o Anxiety depression Osteopenia: DEXA normal 2016 Actinic keratosis History of migraines Dry eyes   Plan: HTN: Previously diagnosis was elevated BP but ambulatory BPs are frequently in the 160s, last month and gynecology BP was 180/90, today is 167/89. Recommend amlodipine 5 mg daily.  Monitor BPs.  RTC 4 weeks Also: CMP, CBC, TSH, FLP DOE, nocturnal wheezing: New symptoms for few months, exam is benign other than elevated BP.  No evidence of volume overload on clinical grounds. EKG today: NSR, no acute changes Plan: Chest x-ray Further evaluate results:  Myoview? Aspirin: Recommend to continue aspirin for now.  Pros cons discussed. Also reports nocturnal hand numbness, will reassess on RTC RTC 4 weeks   This visit occurred during the SARS-CoV-2 public health emergency.  Safety protocols were in place, including screening questions prior to the visit, additional usage of staff PPE, and extensive cleaning of exam room while observing appropriate contact time as indicated for  disinfecting solutions.

## 2019-08-22 DIAGNOSIS — I10 Essential (primary) hypertension: Secondary | ICD-10-CM | POA: Insufficient documentation

## 2019-08-22 NOTE — Assessment & Plan Note (Signed)
HTN: Previously diagnosis was elevated BP but ambulatory BPs are frequently in the 160s, last month and gynecology BP was 180/90, today is 167/89. Recommend amlodipine 5 mg daily.  Monitor BPs.  RTC 4 weeks Also: CMP, CBC, TSH, FLP DOE, nocturnal wheezing: New symptoms for few months, exam is benign other than elevated BP.  No evidence of volume overload on clinical grounds. EKG today: NSR, no acute changes Plan: Chest x-ray Further evaluate results:  Myoview? Aspirin: Recommend to continue aspirin for now.  Pros cons discussed. Also reports nocturnal hand numbness, will reassess on RTC RTC 4 weeks

## 2019-08-27 NOTE — Addendum Note (Signed)
Addended byDamita Dunnings D on: 08/27/2019 09:06 AM   Modules accepted: Orders

## 2019-08-30 ENCOUNTER — Encounter (HOSPITAL_COMMUNITY): Payer: Self-pay | Admitting: Internal Medicine

## 2019-09-10 ENCOUNTER — Encounter (HOSPITAL_COMMUNITY): Payer: Medicare Other

## 2019-09-12 ENCOUNTER — Ambulatory Visit (HOSPITAL_COMMUNITY): Payer: Medicare Other | Attending: Internal Medicine

## 2019-09-12 ENCOUNTER — Other Ambulatory Visit: Payer: Self-pay

## 2019-09-12 VITALS — Ht 64.0 in | Wt 164.0 lb

## 2019-09-12 DIAGNOSIS — R0600 Dyspnea, unspecified: Secondary | ICD-10-CM | POA: Insufficient documentation

## 2019-09-12 LAB — MYOCARDIAL PERFUSION IMAGING
LV dias vol: 38 mL (ref 46–106)
LV sys vol: 14 mL
Peak HR: 129 {beats}/min
Rest HR: 106 {beats}/min
SDS: 3
SRS: 1
SSS: 4
TID: 1.1

## 2019-09-12 MED ORDER — TECHNETIUM TC 99M TETROFOSMIN IV KIT
11.0000 | PACK | Freq: Once | INTRAVENOUS | Status: AC | PRN
  Administered 2019-09-12: 11 via INTRAVENOUS
  Filled 2019-09-12: qty 11

## 2019-09-12 MED ORDER — TECHNETIUM TC 99M TETROFOSMIN IV KIT
31.1000 | PACK | Freq: Once | INTRAVENOUS | Status: AC | PRN
  Administered 2019-09-12: 31.1 via INTRAVENOUS
  Filled 2019-09-12: qty 32

## 2019-09-12 MED ORDER — REGADENOSON 0.4 MG/5ML IV SOLN
0.4000 mg | Freq: Once | INTRAVENOUS | Status: AC
  Administered 2019-09-12: 0.4 mg via INTRAVENOUS

## 2019-09-17 ENCOUNTER — Other Ambulatory Visit: Payer: Self-pay

## 2019-09-18 ENCOUNTER — Other Ambulatory Visit: Payer: Self-pay

## 2019-09-18 ENCOUNTER — Ambulatory Visit (INDEPENDENT_AMBULATORY_CARE_PROVIDER_SITE_OTHER): Payer: Medicare Other | Admitting: Internal Medicine

## 2019-09-18 VITALS — BP 145/55 | HR 85 | Temp 97.8°F | Resp 12 | Ht 64.0 in | Wt 163.6 lb

## 2019-09-18 DIAGNOSIS — I10 Essential (primary) hypertension: Secondary | ICD-10-CM

## 2019-09-18 DIAGNOSIS — E785 Hyperlipidemia, unspecified: Secondary | ICD-10-CM

## 2019-09-18 DIAGNOSIS — R0600 Dyspnea, unspecified: Secondary | ICD-10-CM

## 2019-09-18 DIAGNOSIS — R32 Unspecified urinary incontinence: Secondary | ICD-10-CM

## 2019-09-18 DIAGNOSIS — R0609 Other forms of dyspnea: Secondary | ICD-10-CM

## 2019-09-18 NOTE — Progress Notes (Signed)
Subjective:    Patient ID: Kelly Schneider, female    DOB: 10-20-1950, 69 y.o.   MRN: [id number]  DOS:  09/18/2019 Type of visit - description: Follow-up  Since the last office visit she had a number of labs and tests.  They were reviewed with the patient  I review her ambulatory blood pressures  She reports bilateral hand numbness, sometimes in the left and sometimes it is the right.  Typically after sleeping, it is all fingers distally. Symptoms self resolve after she moves her hands and warms up.  DOE: On looking back, the patient thinks symptoms are related to stress when she tends to hyperventilate.  She is active without symptoms.  Today she denies nocturnal wheezing.  Review of Systems  Denies dysuria, gross hematuria or difficulty urinating Occasionally has mild dizziness since he started amlodipine but the symptoms are getting less noticeable.  No headache, diplopia or slurred speech.  Past Medical History:  Diagnosis Date  . Actinic keratosis    Right superior central posterior tibial shave, Dr. Fontaine No  . Anxiety    Pt does not like anything in her nose.  . 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 Depression    h/o  . Dry eyes   . Hx of endometriosis   . Hx of migraines   . Ovarian cyst    69 years old  . Ulcer    15 y o    Past Surgical History:  Procedure Laterality Date  . ABDOMINAL HYSTERECTOMY  1987   Total  . APPENDECTOMY    . CHOLECYSTECTOMY    . LAPAROSCOPY  1990   For Endometriosis  . RIGHT OOPHORECTOMY  1970   right tube removed  . TONSILLECTOMY      Social History   Socioeconomic History  . Marital status: Married    Spouse name: Not on file  . Number of children: 0  . Years of education: Not on file  . Highest education level: Not on file  Occupational History  . Occupation: retired  Tobacco Use  . Smoking status: Never Smoker  . Smokeless tobacco: Never Used  Substance and  Sexual Activity  . Alcohol use: Yes    Alcohol/week: 7.0 standard drinks    Types: 7 Glasses of wine per week  . Drug use: No  . Sexual activity: Not on file  Other Topics Concern  . Not on file  Social History Narrative   Married w/ Optician, dispensing    Social Determinants of Health   Financial Resource Strain:   . Difficulty of Paying Living Expenses: Not on file  Food Insecurity:   . Worried About Charity fundraiser in the Last Year: Not on file  . Ran Out of Food in the Last Year: Not on file  Transportation Needs:   . Lack of Transportation (Medical): Not on file  . Lack of Transportation (Non-Medical): Not on file  Physical Activity:   . Days of Exercise per Week: Not on file  . Minutes of Exercise per Session: Not on file  Stress:   . Feeling of Stress : Not on file  Social Connections:   . Frequency of Communication with Friends and Family: Not on file  . Frequency of Social Gatherings with Friends and Family: Not on file  . Attends Religious Services: Not on file  . Active Member of Clubs or Organizations: Not on file  . Attends Archivist Meetings: Not  on file  . Marital Status: Not on file  Intimate Partner Violence:   . Fear of Current or Ex-Partner: Not on file  . Emotionally Abused: Not on file  . Physically Abused: Not on file  . Sexually Abused: Not on file      Allergies as of 09/18/2019      Reactions   Penicillins    REACTION: nausea      Medication List       Accurate as of September 18, 2019  1:31 PM. If you have any questions, ask your nurse or doctor.        amLODipine 5 MG tablet Commonly known as: NORVASC Take 1 tablet (5 mg total) by mouth daily.   aspirin EC 81 MG tablet Take 81 mg by mouth daily.   cycloSPORINE 0.05 % ophthalmic emulsion Commonly known as: RESTASIS Place 1 drop into both eyes 2 (two) times daily.   estradiol 1 MG tablet Commonly known as: ESTRACE Take 1 mg by mouth daily.   ONE-A-DAY 50 PLUS PO Take by mouth  daily.   POTASSIMIN PO Take 550 mg by mouth daily.   THERA TEARS NUTRITION PO Take by mouth. Take 2 capsules daily   TURMERIC PO Take 1 tablet by mouth daily.   VIACTIV PO Take by mouth. Viactive chews-Take 1 pill bid   VITAMIN C PO Take 375 mg by mouth daily.           Objective:   Physical Exam BP (!) 145/55 (BP Location: Right Arm, Cuff Size: Normal)   Pulse 85   Temp 97.8 F (36.6 C) (Temporal)   Resp 12   Ht 5\' 4"  (1.626 m)   Wt 163 lb 9.6 oz (74.2 kg)   SpO2 99%   BMI 28.08 kg/m  General:   Well developed, NAD, BMI noted. HEENT:  Normocephalic . Face symmetric, atraumatic Lungs:  CTA B Normal respiratory effort, no intercostal retractions, no accessory muscle use. Heart: RRR,  no murmur.  No pretibial edema bilaterally  Skin: Not pale. Not jaundice Neurologic:  alert & oriented X3.  Speech normal, gait appropriate for age and unassisted Psych--  Cognition and judgment appear intact.  Cooperative with normal attention span and concentration.  Behavior appropriate. No anxious or depressed appearing.      Assessment     Assessment H/o Anxiety depression Osteopenia: DEXA normal 2016 Actinic keratosis History of migraines Dry eyes  DOE: Low risk Myoview 12/30-20 20  Plan: HTN: Good compliance and tolerance with amlodipine, ambulatory BPs for the last 6 weeks reviewed, in the last 2 weeks they are being consistently within goal.  No change, continue monitoring. DOE nocturnal wheezing: on looking back the patient think that the DOE is rather related to stress and hyperventilation.  Stress test negative.  No further evaluation at this time Mild dyslipidemia: Patient is concerned, advised that her dyslipidemia is mild (10-year cardiovascular RF is 14.8%).  She is electing to work on her already healthy diet and recheck in few months. Hand numbness: As described above, related to neck DJD?  She denies neck pain per se.  We agreed on  observation Aspirin: She prefers to continue low-dose aspirin for now. Bladder incontinence: The patient reports bladder incontinence without those symptoms, she started Kegel exercises for the last 3 weeks and is already helping.  Reassess periodically. RTC 3 to 4 months   This visit occurred during the SARS-CoV-2 public health emergency.  Safety protocols were in place, including screening questions  prior to the visit, additional usage of staff PPE, and extensive cleaning of exam room while observing appropriate contact time as indicated for disinfecting solutions.

## 2019-09-18 NOTE — Patient Instructions (Addendum)
  GO TO THE FRONT DESK Schedule your next appointment   For a check up in 3-4 months    Continue checking your blood pressures BP GOAL is between 110/65 and  135/85. If it is consistently higher or lower, let me know

## 2019-09-19 DIAGNOSIS — E785 Hyperlipidemia, unspecified: Secondary | ICD-10-CM | POA: Insufficient documentation

## 2019-09-19 DIAGNOSIS — R32 Unspecified urinary incontinence: Secondary | ICD-10-CM | POA: Insufficient documentation

## 2019-09-19 NOTE — Assessment & Plan Note (Signed)
HTN: Good compliance and tolerance with amlodipine, ambulatory BPs for the last 6 weeks reviewed, in the last 2 weeks they are being consistently within goal.  No change, continue monitoring. DOE nocturnal wheezing: on looking back the patient think that the DOE is rather related to stress and hyperventilation.  Stress test negative.  No further evaluation at this time Mild dyslipidemia: Patient is concerned, advised that her dyslipidemia is mild (10-year cardiovascular RF is 14.8%).  She is electing to work on her already healthy diet and recheck in few months. Hand numbness: As described above, related to neck DJD?  She denies neck pain per se.  We agreed on observation Aspirin: She prefers to continue low-dose aspirin for now. Bladder incontinence: The patient reports bladder incontinence without those symptoms, she started Kegel exercises for the last 3 weeks and is already helping.  Reassess periodically. RTC 3 to 4 months

## 2019-09-24 ENCOUNTER — Other Ambulatory Visit: Payer: Self-pay | Admitting: *Deleted

## 2019-09-24 MED ORDER — AMLODIPINE BESYLATE 5 MG PO TABS: 5.0000 mg | ORAL_TABLET | Freq: Every day | ORAL | 1 refills | Status: DC

## 2019-11-05 NOTE — Progress Notes (Signed)
Virtual Visit via Audio Note  I connected with patient on 11/06/19 at 11:45 AM EST by audio enabled telemedicine application and verified that I am speaking with the correct person using two identifiers.   THIS ENCOUNTER IS A VIRTUAL VISIT DUE TO COVID-19 - PATIENT WAS NOT SEEN IN THE OFFICE. PATIENT HAS CONSENTED TO VIRTUAL VISIT / TELEMEDICINE VISIT   Location of patient: home  Location of provider: office  I discussed the limitations of evaluation and management by telemedicine and the availability of in person appointments. The patient expressed understanding and agreed to proceed.   Subjective:   Kelly Schneider is a 69 y.o. female who presents for Medicare Annual (Subsequent) preventive examination.  Review of Systems:  Home Safety/Smoke Alarms: Feels safe in home. Smoke alarms in place.  Lives w/ husband in 1 story home.    Female:         Mammo- 07/24/19      Dexa scan-  04/15/15      CCS- 07/13/16. REcall 10 yrs.    Objective:     Vitals: BP 119/72 Comment: pt reported  There is no height or weight on file to calculate BMI.  Advanced Directives 11/06/2019 07/13/2016  Does Patient Have a Medical Advance Directive? Yes Yes  Type of Kelly Schneider;Living will -  Does patient want to make changes to medical advance directive? No - Patient declined -  Copy of Kelly Schneider in Chart? No - copy requested -    Tobacco Social History   Tobacco Use  Smoking Status Never Smoker  Smokeless Tobacco Never Used     Counseling given: Not Answered   Clinical Intake: Pain : No/denies pain     Past Medical History:  Diagnosis Date  . Actinic keratosis    Right superior central posterior tibial shave, Dr. Fontaine No  . Anxiety    Pt does not like anything in her nose.  . 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 Depression    h/o  . Dry eyes   . Hx of endometriosis   . Hx  of migraines   . Ovarian cyst    69 years old  . Ulcer    15 y o   Past Surgical History:  Procedure Laterality Date  . ABDOMINAL HYSTERECTOMY  1987   Total  . APPENDECTOMY    . CHOLECYSTECTOMY    . LAPAROSCOPY  1990   For Endometriosis  . RIGHT OOPHORECTOMY  1970   right tube removed  . TONSILLECTOMY     Family History  Problem Relation Age of Onset  . CAD Other        GM, uncle  . Diabetes Other        mother side   . Colon cancer Neg Hx   . Breast cancer Neg Hx    Social History   Socioeconomic History  . Marital status: Married    Spouse name: Not on file  . Number of children: 0  . Years of education: Not on file  . Highest education level: Not on file  Occupational History  . Occupation: retired  Tobacco Use  . Smoking status: Never Smoker  . Smokeless tobacco: Never Used  Substance and Sexual Activity  . Alcohol use: Yes    Alcohol/week: 7.0 standard drinks    Types: 7 Glasses of wine per week  . Drug use: No  . Sexual activity: Not on file  Other Topics Concern  . Not on file  Social History Narrative   Married w/ Kelly Schneider    Social Determinants of Health   Financial Resource Strain: Low Risk   . Difficulty of Paying Living Expenses: Not hard at all  Food Insecurity: No Food Insecurity  . Worried About Charity fundraiser in the Last Year: Never true  . Ran Out of Food in the Last Year: Never true  Transportation Needs: No Transportation Needs  . Lack of Transportation (Medical): No  . Lack of Transportation (Non-Medical): No  Physical Activity:   . Days of Exercise per Week: Not on file  . Minutes of Exercise per Session: Not on file  Stress:   . Feeling of Stress : Not on file  Social Connections:   . Frequency of Communication with Friends and Family: Not on file  . Frequency of Social Gatherings with Friends and Family: Not on file  . Attends Religious Services: Not on file  . Active Member of Clubs or Organizations: Not on file  .  Attends Archivist Meetings: Not on file  . Marital Status: Not on file    Outpatient Encounter Medications as of 11/06/2019  Medication Sig  . amLODipine (NORVASC) 5 MG tablet Take 1 tablet (5 mg total) by mouth daily.  . Ascorbic Acid (VITAMIN C PO) Take 375 mg by mouth daily.  Marland Kitchen aspirin EC 81 MG tablet Take 81 mg by mouth daily.  . Calcium-Vitamin D-Vitamin K (VIACTIV PO) Take by mouth. Viactive chews-Take 1 pill bid  . cycloSPORINE (RESTASIS) 0.05 % ophthalmic emulsion Place 1 drop into both eyes 2 (two) times daily.  . DHA-EPA-Flaxseed Oil-Vitamin E (THERA TEARS NUTRITION PO) Take by mouth. Take 2 capsules daily  . estradiol (ESTRACE) 1 MG tablet Take 1 mg by mouth daily.  . Multiple Vitamins-Minerals (ONE-A-DAY 50 PLUS PO) Take by mouth daily.  . TURMERIC PO Take 1 tablet by mouth daily.  . Potassium (POTASSIMIN PO) Take 550 mg by mouth daily.   No facility-administered encounter medications on file as of 11/06/2019.    Activities of Daily Living In your present state of health, do you have any difficulty performing the following activities: 11/06/2019 08/21/2019  Hearing? N N  Vision? N N  Difficulty concentrating or making decisions? N N  Walking or climbing stairs? N N  Dressing or bathing? N N  Doing errands, shopping? N N  Preparing Food and eating ? N -  Using the Toilet? N -  In the past six months, have you accidently leaked urine? N -  Do you have problems with loss of bowel control? N -  Managing your Medications? N -  Managing your Finances? N -  Housekeeping or managing your Housekeeping? N -  Some recent data might be hidden    Patient Care Team: Colon Branch, MD as PCP - Anson Fret, MD as Consulting Physician (Dermatology) Bobbye Charleston, MD as Consulting Physician (Obstetrics and Gynecology)    Assessment:   This is a routine wellness examination for Kelly Schneider. Physical assessment deferred to PCP.  Exercise Activities and  Dietary recommendations Current Exercise Habits: Home exercise routine, Type of exercise: strength training/weights(stationary bike and trampoline), Time (Minutes): 30, Frequency (Times/Week): 5, Weekly Exercise (Minutes/Week): 150, Intensity: Mild, Exercise limited by: None identified Diet (meal preparation, eat out, water intake, caffeinated beverages, dairy products, fruits and vegetables): in general, a "healthy" diet  , well balanced  Goals    . Maintain healthy  active lifestyle.       Fall Risk Fall Risk  11/06/2019 08/21/2019 08/02/2018 04/12/2018 07/23/2016  Falls in the past year? 0 1 1 Yes No  Comment - - Emmi Telephone Survey: data to providers prior to load Emmi Telephone Survey: data to providers prior to load -  Number falls in past yr: 0 0 1 1 -  Comment - - Emmi Telephone Survey Actual Response = 1 Emmi Telephone Survey Actual Response = 1 -  Injury with Fall? 0 0 1 Yes -  Follow up Education provided;Falls prevention discussed Falls evaluation completed - - -   Depression Screen PHQ 2/9 Scores 11/06/2019 08/21/2019 07/23/2016 03/18/2015  PHQ - 2 Score 0 0 0 0     Cognitive Function   Ad8 score reviewed for issues:  Issues making decisions:no  Less interest in hobbies / activities:no  Repeats questions, stories (family complaining):no  Trouble using ordinary gadgets (microwave, computer, phone):no  Forgets the month or year: no  Mismanaging finances: no  Remembering appts:no  Daily problems with thinking and/or memory:no Ad8 score is=0      Immunization History  Administered Date(s) Administered  . Influenza Split 07/01/2018, 05/29/2019  . Influenza Whole 10/09/2010  . Influenza, High Dose Seasonal PF 07/23/2016  . Influenza-Unspecified 08/12/2014, 07/15/2015, 08/16/2017  . Pneumococcal Conjugate-13 04/15/2015  . Pneumococcal Polysaccharide-23 07/23/2016  . Tdap 04/15/2015  . Zoster 05/05/2010   Screening Tests Health Maintenance  Topic Date Due  .  MAMMOGRAM  07/23/2020  . TETANUS/TDAP  04/14/2025  . COLONOSCOPY  07/13/2026  . INFLUENZA VACCINE  Completed  . DEXA SCAN  Completed  . Hepatitis C Screening  Completed  . PNA vac Low Risk Adult  Completed      Plan:    Please schedule your next medicare wellness visit with me in 1 yr.  Continue to eat heart healthy diet (full of fruits, vegetables, whole grains, lean protein, water--limit salt, fat, and sugar intake) and increase physical activity as tolerated.  Continue doing brain stimulating activities (puzzles, reading, adult coloring books, staying active) to keep memory sharp.   Bring a copy of your living will and/or healthcare power of attorney to your next office visit.    I have personally reviewed and noted the following in the patient's chart:   . Medical and social history . Use of alcohol, tobacco or illicit drugs  . Current medications and supplements . Functional ability and status . Nutritional status . Physical activity . Advanced directives . List of other physicians . Hospitalizations, surgeries, and ER visits in previous 12 months . Vitals . Screenings to include cognitive, depression, and falls . Referrals and appointments  In addition, I have reviewed and discussed with patient certain preventive protocols, quality metrics, and best practice recommendations. A written personalized care plan for preventive services as well as general preventive health recommendations were provided to patient.     Naaman Plummer [place], [place]  11/06/2019

## 2019-11-06 ENCOUNTER — Encounter: Payer: Self-pay | Admitting: *Deleted

## 2019-11-06 ENCOUNTER — Ambulatory Visit (INDEPENDENT_AMBULATORY_CARE_PROVIDER_SITE_OTHER): Payer: Medicare Other | Admitting: *Deleted

## 2019-11-06 ENCOUNTER — Other Ambulatory Visit: Payer: Self-pay

## 2019-11-06 ENCOUNTER — Telehealth: Payer: Self-pay | Admitting: Internal Medicine

## 2019-11-06 VITALS — BP 119/72

## 2019-11-06 DIAGNOSIS — Z0000 Encounter for general adult medical examination without abnormal findings: Secondary | ICD-10-CM

## 2019-11-06 NOTE — Patient Instructions (Signed)
Please schedule your next medicare wellness visit with me in 1 yr.  Continue to eat heart healthy diet (full of fruits, vegetables, whole grains, lean protein, water--limit salt, fat, and sugar intake) and increase physical activity as tolerated.  Continue doing brain stimulating activities (puzzles, reading, adult coloring books, staying active) to keep memory sharp.   Bring a copy of your living will and/or healthcare power of attorney to your next office visit.   Kelly Schneider , Thank you for taking time to come for your Medicare Wellness Visit. I appreciate your ongoing commitment to your health goals. Please review the following plan we discussed and let me know if I can assist you in the future.   These are the goals we discussed: Goals    . Maintain healthy active lifestyle.       This is a list of the screening recommended for you and due dates:  Health Maintenance  Topic Date Due  . Mammogram  07/23/2020  . Tetanus Vaccine  04/14/2025  . Colon Cancer Screening  07/13/2026  . Flu Shot  Completed  . DEXA scan (bone density measurement)  Completed  .  Hepatitis C: One time screening is recommended by Center for Disease Control  (CDC) for  adults born from [birth year] through 1967.   Completed  . Pneumonia vaccines  Completed    Preventive Care 55 Years and Older, Female Preventive care refers to lifestyle choices and visits with your health care provider that can promote health and wellness. This includes:  A yearly physical exam. This is also called an annual well check.  Regular dental and eye exams.  Immunizations.  Screening for certain conditions.  Healthy lifestyle choices, such as diet and exercise. What can I expect for my preventive care visit? Physical exam Your health care provider will check:  Height and weight. These may be used to calculate body mass index (BMI), which is a measurement that tells if you are at a healthy weight.  Heart rate and blood  pressure.  Your skin for abnormal spots. Counseling Your health care provider may ask you questions about:  Alcohol, tobacco, and drug use.  Emotional well-being.  Home and relationship well-being.  Sexual activity.  Eating habits.  History of falls.  Memory and ability to understand (cognition).  Work and work Statistician.  Pregnancy and menstrual history. What immunizations do I need?  Influenza (flu) vaccine  This is recommended every year. Tetanus, diphtheria, and pertussis (Tdap) vaccine  You may need a Td booster every 10 years. Varicella (chickenpox) vaccine  You may need this vaccine if you have not already been vaccinated. Zoster (shingles) vaccine  You may need this after age 32. Pneumococcal conjugate (PCV13) vaccine  One dose is recommended after age 75. Pneumococcal polysaccharide (PPSV23) vaccine  One dose is recommended after age 24. Measles, mumps, and rubella (MMR) vaccine  You may need at least one dose of MMR if you were born in 1957 or later. You may also need a second dose. Meningococcal conjugate (MenACWY) vaccine  You may need this if you have certain conditions. Hepatitis A vaccine  You may need this if you have certain conditions or if you travel or work in places where you may be exposed to hepatitis A. Hepatitis B vaccine  You may need this if you have certain conditions or if you travel or work in places where you may be exposed to hepatitis B. Haemophilus influenzae type b (Hib) vaccine  You may need this  if you have certain conditions. You may receive vaccines as individual doses or as more than one vaccine together in one shot (combination vaccines). Talk with your health care provider about the risks and benefits of combination vaccines. What tests do I need? Blood tests  Lipid and cholesterol levels. These may be checked every 5 years, or more frequently depending on your overall health.  Hepatitis C test.  Hepatitis B  test. Screening  Lung cancer screening. You may have this screening every year starting at age 34 if you have a 30-pack-year history of smoking and currently smoke or have quit within the past 15 years.  Colorectal cancer screening. All adults should have this screening starting at age 30 and continuing until age 68. Your health care provider may recommend screening at age 33 if you are at increased risk. You will have tests every 1-10 years, depending on your results and the type of screening test.  Diabetes screening. This is done by checking your blood sugar (glucose) after you have not eaten for a while (fasting). You may have this done every 1-3 years.  Mammogram. This may be done every 1-2 years. Talk with your health care provider about how often you should have regular mammograms.  BRCA-related cancer screening. This may be done if you have a family history of breast, ovarian, tubal, or peritoneal cancers. Other tests  Sexually transmitted disease (STD) testing.  Bone density scan. This is done to screen for osteoporosis. You may have this done starting at age 29. Follow these instructions at home: Eating and drinking  Eat a diet that includes fresh fruits and vegetables, whole grains, lean protein, and low-fat dairy products. Limit your intake of foods with high amounts of sugar, saturated fats, and salt.  Take vitamin and mineral supplements as recommended by your health care provider.  Do not drink alcohol if your health care provider tells you not to drink.  If you drink alcohol: ? Limit how much you have to 0-1 drink a day. ? Be aware of how much alcohol is in your drink. In the [place] one drink equals one 12 oz bottle of beer (355 mL), one 5 oz glass of wine (148 mL), or one 1 oz glass of hard liquor (44 mL). Lifestyle  Take daily care of your teeth and gums.  Stay active. Exercise for at least 30 minutes on 5 or more days each week.  Do not use any products that  contain nicotine or tobacco, such as cigarettes, e-cigarettes, and chewing tobacco. If you need help quitting, ask your health care provider.  If you are sexually active, practice safe sex. Use a condom or other form of protection in order to prevent STIs (sexually transmitted infections).  Talk with your health care provider about taking a low-dose aspirin or statin. What's next?  Go to your health care provider once a year for a well check visit.  Ask your health care provider how often you should have your eyes and teeth checked.  Stay up to date on all vaccines. This information is not intended to replace advice given to you by your health care provider. Make sure you discuss any questions you have with your health care provider. Document Revised: 08/24/2018 Document Reviewed: 08/24/2018 Elsevier Patient Education  2020 [employer].

## 2019-11-06 NOTE — Telephone Encounter (Signed)
Patient states that she added Benecol Chews to her medications, just fyi

## 2020-01-18 ENCOUNTER — Ambulatory Visit: Payer: Medicare Other | Admitting: Internal Medicine

## 2020-01-18 ENCOUNTER — Other Ambulatory Visit: Payer: Self-pay

## 2020-01-18 ENCOUNTER — Ambulatory Visit (INDEPENDENT_AMBULATORY_CARE_PROVIDER_SITE_OTHER): Payer: Medicare Other | Admitting: Internal Medicine

## 2020-01-18 ENCOUNTER — Encounter: Payer: Self-pay | Admitting: Internal Medicine

## 2020-01-18 VITALS — BP 144/72 | HR 92 | Temp 97.9°F | Resp 18 | Ht 64.0 in | Wt 158.5 lb

## 2020-01-18 DIAGNOSIS — E785 Hyperlipidemia, unspecified: Secondary | ICD-10-CM

## 2020-01-18 DIAGNOSIS — I10 Essential (primary) hypertension: Secondary | ICD-10-CM

## 2020-01-18 NOTE — Patient Instructions (Signed)
   GO TO THE LAB : Get the blood work     GO TO THE FRONT DESK, PLEASE SCHEDULE YOUR APPOINTMENTS Come back for  A check up in 6 months

## 2020-01-18 NOTE — Progress Notes (Signed)
Pre visit review using our clinic review tool, if applicable. No additional management support is needed unless otherwise documented below in the visit note. 

## 2020-01-18 NOTE — Progress Notes (Signed)
Subjective:    Patient ID: Kelly Schneider, female    DOB: 08/06/1951, 69 y.o.   MRN: [id number]  DOS:  01/18/2020 Type of visit - description: Follow-up Follow-up from the last visit, she is feeling great. Today we review her BP readings and her previous cholesterol values.  Review of Systems  Life style is very good, is even better than before, exercising almost every day and eating healthy  Past Medical History:  Diagnosis Date  . Actinic keratosis    Right superior central posterior tibial shave, Dr. Fontaine No  . Anxiety    Pt does not like anything in her nose.  . 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 Depression    h/o  . Dry eyes   . Hx of endometriosis   . Hx of migraines   . Ovarian cyst    69 years old  . Ulcer    15 y o    Past Surgical History:  Procedure Laterality Date  . ABDOMINAL HYSTERECTOMY  1987   Total  . APPENDECTOMY    . CHOLECYSTECTOMY    . LAPAROSCOPY  1990   For Endometriosis  . RIGHT OOPHORECTOMY  1970   right tube removed  . TONSILLECTOMY      Allergies as of 01/18/2020      Reactions   Penicillins    REACTION: nausea      Medication List       Accurate as of Jan 18, 2020 11:59 PM. If you have any questions, ask your nurse or doctor.        amLODipine 5 MG tablet Commonly known as: NORVASC Take 1 tablet (5 mg total) by mouth daily.   aspirin EC 81 MG tablet Take 81 mg by mouth daily.   cycloSPORINE 0.05 % ophthalmic emulsion Commonly known as: RESTASIS Place 1 drop into both eyes 2 (two) times daily. What changed: Another medication with the same name was removed. Continue taking this medication, and follow the directions you see here. Changed by: Kathlene November, MD   estradiol 1 MG tablet Commonly known as: ESTRACE Take 1 mg by mouth daily.   ONE-A-DAY 50 PLUS PO Take by mouth daily.   POTASSIMIN PO Take 550 mg by mouth daily.   THERA TEARS NUTRITION PO Take by mouth.  Take 2 capsules daily   TURMERIC PO Take 1 tablet by mouth daily.   VIACTIV PO Take by mouth. Viactive chews-Take 1 pill bid   VITAMIN C PO Take 375 mg by mouth daily.          Objective:   Physical Exam BP (!) 144/72 (BP Location: Left Arm, Patient Position: Sitting, Cuff Size: Small)   Pulse 92   Temp 97.9 F (36.6 C) (Temporal)   Resp 18   Ht 5\' 4"  (1.626 m)   Wt 158 lb 8 oz (71.9 kg)   SpO2 99%   BMI 27.21 kg/m  General:   Well developed, NAD, BMI noted. HEENT:  Normocephalic . Face symmetric, atraumatic Lungs:  CTA B Normal respiratory effort, no intercostal retractions, no accessory muscle use. Heart: RRR,  no murmur.  Lower extremities: no pretibial edema bilaterally  Skin: Not pale. Not jaundice Neurologic:  alert & oriented X3.  Speech normal, gait appropriate for age and unassisted Psych--  Cognition and judgment appear intact.  Cooperative with normal attention span and concentration.  Behavior appropriate. No anxious or depressed appearing.      Assessment  Assessment HTN H/o Anxiety depression Osteopenia: DEXA normal 2016 Actinic keratosis History of migraines Dry eyes  DOE: Low risk Myoview 12/30-20 20  Plan: HTN: Ambulatory BPs 120/60, 130/70.  Last BMP satisfactory, continue amlodipine, no edema. Mild dyslipidemia: Since the last visit she is doing even better than before, exercise almost every day and eat healthy.  We agreed to recheck a FLP. Her last calculated CV RF was 14.8%, I believe most of it is age-related.   This visit occurred during the SARS-CoV-2 public health emergency.  Safety protocols were in place, including screening questions prior to the visit, additional usage of staff PPE, and extensive cleaning of exam room while observing appropriate contact time as indicated for disinfecting solutions.

## 2020-01-19 LAB — LIPID PANEL
Cholesterol: 190 mg/dL (ref <200)
HDL: 97 mg/dL (ref >=50)
LDL Cholesterol (Calc): 72 mg/dL (calc)
Non-HDL Cholesterol (Calc): 93 mg/dL (calc) (ref <130)
Total CHOL/HDL Ratio: 2 (calc) (ref <5.0)
Triglycerides: 133 mg/dL (ref <150)

## 2020-01-19 NOTE — Assessment & Plan Note (Signed)
Preventive care reviewed : Pap smear and mammogram 07-2019 per K PN Status post 2 Covid shots. RTC 6 months

## 2020-01-19 NOTE — Assessment & Plan Note (Signed)
HTN: Ambulatory BPs 120/60, 130/70.  Last BMP satisfactory, continue amlodipine, no edema. Mild dyslipidemia: Since the last visit she is doing even better than before, exercise almost every day and eat healthy.  We agreed to recheck a FLP. Her last calculated CV RF was 14.8%, I believe most of it is age-related.

## 2020-02-16 ENCOUNTER — Other Ambulatory Visit: Payer: Self-pay | Admitting: Internal Medicine

## 2020-05-30 DIAGNOSIS — H04123 Dry eye syndrome of bilateral lacrimal glands: Secondary | ICD-10-CM | POA: Diagnosis not present

## 2020-05-30 DIAGNOSIS — H2513 Age-related nuclear cataract, bilateral: Secondary | ICD-10-CM | POA: Diagnosis not present

## 2020-05-30 DIAGNOSIS — H524 Presbyopia: Secondary | ICD-10-CM | POA: Diagnosis not present

## 2020-06-25 DIAGNOSIS — Z23 Encounter for immunization: Secondary | ICD-10-CM | POA: Diagnosis not present

## 2020-07-01 DIAGNOSIS — Z23 Encounter for immunization: Secondary | ICD-10-CM | POA: Diagnosis not present

## 2020-07-25 ENCOUNTER — Ambulatory Visit: Payer: Medicare Other | Admitting: Internal Medicine

## 2020-07-25 ENCOUNTER — Encounter: Payer: Self-pay | Admitting: Internal Medicine

## 2020-08-15 ENCOUNTER — Telehealth: Payer: Self-pay

## 2020-08-15 NOTE — Telephone Encounter (Signed)
Immunizations updated in EHR.

## 2020-08-15 NOTE — Telephone Encounter (Signed)
Patient called to report she received her annual flu shot at [facility] on 06/25/20.

## 2020-08-22 ENCOUNTER — Ambulatory Visit (INDEPENDENT_AMBULATORY_CARE_PROVIDER_SITE_OTHER): Payer: Medicare Other | Admitting: Internal Medicine

## 2020-08-22 ENCOUNTER — Other Ambulatory Visit: Payer: Self-pay

## 2020-08-22 VITALS — BP 159/82 | HR 96 | Temp 97.8°F | Ht 64.0 in | Wt 154.0 lb

## 2020-08-22 DIAGNOSIS — Z780 Asymptomatic menopausal state: Secondary | ICD-10-CM | POA: Diagnosis not present

## 2020-08-22 DIAGNOSIS — E785 Hyperlipidemia, unspecified: Secondary | ICD-10-CM | POA: Diagnosis not present

## 2020-08-22 DIAGNOSIS — I10 Essential (primary) hypertension: Secondary | ICD-10-CM

## 2020-08-22 LAB — COMPREHENSIVE METABOLIC PANEL
AG Ratio: 1.8 (calc) (ref 1.0–2.5)
ALT: 16 U/L (ref 6–29)
AST: 17 U/L (ref 10–35)
Albumin: 4.2 g/dL (ref 3.6–5.1)
Alkaline phosphatase (APISO): 86 U/L (ref 37–153)
BUN: 13 mg/dL (ref 7–25)
CO2: 26 mmol/L (ref 20–32)
Calcium: 9.2 mg/dL (ref 8.6–10.4)
Chloride: 102 mmol/L (ref 98–110)
Creat: 0.77 mg/dL (ref 0.60–0.93)
Globulin: 2.4 g/dL (calc) (ref 1.9–3.7)
Glucose, Bld: 95 mg/dL (ref 65–99)
Potassium: 3.9 mmol/L (ref 3.5–5.3)
Sodium: 138 mmol/L (ref 135–146)
Total Bilirubin: 0.4 mg/dL (ref 0.2–1.2)
Total Protein: 6.6 g/dL (ref 6.1–8.1)

## 2020-08-22 LAB — LIPID PANEL
Cholesterol: 177 mg/dL (ref <200)
HDL: 95 mg/dL (ref >=50)
LDL Cholesterol (Calc): 65 mg/dL (calc)
Non-HDL Cholesterol (Calc): 82 mg/dL (calc) (ref <130)
Total CHOL/HDL Ratio: 1.9 (calc) (ref <5.0)
Triglycerides: 88 mg/dL (ref <150)

## 2020-08-22 NOTE — Patient Instructions (Addendum)
   GO TO THE LAB : Get the blood work     GO TO THE FRONT DESK, PLEASE SCHEDULE YOUR APPOINTMENTS Come back for a check up in 6 to 8 months      STOP BY THE FIRST FLOOR: Schedule your bone density test

## 2020-08-22 NOTE — Progress Notes (Signed)
Subjective:    Patient ID: Kelly Schneider, female    DOB: August 22, 1951, 69 y.o.   MRN: [id number]  DOS:  08/22/2020 Type of visit - description: f/u Since the last office visit she is doing great. Has changed her lifestyle, lost some weight.    Review of Systems Denies chest pain no difficulty breathing No nausea or vomiting.   Past Medical History:  Diagnosis Date  . Actinic keratosis    Right superior central posterior tibial shave, Dr. Fontaine No  . Anxiety    Pt does not like anything in her nose.  . 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 Depression    h/o  . Dry eyes   . Hx of endometriosis   . Hx of migraines   . Ovarian cyst    69 years old  . Ulcer    15 y o    Past Surgical History:  Procedure Laterality Date  . ABDOMINAL HYSTERECTOMY  1987   Total  . APPENDECTOMY    . CHOLECYSTECTOMY    . LAPAROSCOPY  1990   For Endometriosis  . RIGHT OOPHORECTOMY  1970   right tube removed  . TONSILLECTOMY      Allergies as of 08/22/2020      Reactions   Penicillins    REACTION: nausea      Medication List       Accurate as of August 22, 2020 11:59 PM. If you have any questions, ask your nurse or doctor.        amLODipine 5 MG tablet Commonly known as: NORVASC Take 1 tablet (5 mg total) by mouth daily.   aspirin EC 81 MG tablet Take 81 mg by mouth daily.   cycloSPORINE 0.05 % ophthalmic emulsion Commonly known as: RESTASIS Place 1 drop into both eyes 2 (two) times daily.   estradiol 1 MG tablet Commonly known as: ESTRACE Take 1 mg by mouth daily.   ONE-A-DAY 50 PLUS PO Take by mouth daily.   POTASSIMIN PO Take 550 mg by mouth daily.   THERA TEARS NUTRITION PO Take by mouth. Take 2 capsules daily   TURMERIC PO Take 1 tablet by mouth daily.   VIACTIV PO Take by mouth. Viactive chews-Take 1 pill bid   VITAMIN C PO Take 375 mg by mouth daily.          Objective:   Physical  Exam BP (!) 159/82 (BP Location: Right Arm, Patient Position: Sitting, Cuff Size: Large)   Pulse 96   Temp 97.8 F (36.6 C) (Oral)   Ht 5\' 4"  (1.626 m)   Wt 154 lb (69.9 kg)   SpO2 100%   BMI 26.43 kg/m  General:   Well developed, NAD, BMI noted.  HEENT:  Normocephalic . Face symmetric, atraumatic Neck: No thyromegaly Lungs:  CTA B Normal respiratory effort, no intercostal retractions, no accessory muscle use. Heart: RRR,  no murmur.  Abdomen:  Not distended, soft, non-tender.  Barely palpable aorta at the upper abdomen with no bruits or tenderness. Skin: Not pale. Not jaundice Lower extremities: no pretibial edema bilaterally  Neurologic:  alert & oriented X3.  Speech normal, gait appropriate for age and unassisted Psych--  Cognition and judgment appear intact.  Cooperative with normal attention span and concentration.  Behavior appropriate. No anxious or depressed appearing.     Assessment    ASSESSMENT HTN H/o Anxiety depression Osteopenia: DEXA normal 2016 Actinic keratosis History of migraines Dry eyes  DOE: Low risk Myoview 12/30-20 20  Plan: ZDG:[id number] on amlodipine, ambulatory BPs are all in the 120s over 60s.  Check a CMP.  No change Dyslipidemia: Her cholesterol levels are okay but her CV RF @ 10 years is somewhat elevated, mostly due to age. In the last 12 months she has completely changed how she eats, exercises 24 days out of a month on average, has lost approximately 12 pounds,  Rode 1100 miles on her  stationary bike ! We will recheck a FLP but probably not to start statins. Preventive care: Had her COVID vaccine x3 and flu shot. Due for a DEXA, as above she is active, taking calcium and vitamin D. RTC 6 to 8 months   This visit occurred during the SARS-CoV-2 public health emergency.  Safety protocols were in place, including screening questions prior to the visit, additional usage of staff PPE, and extensive cleaning of exam room while  observing appropriate contact time as indicated for disinfecting solutions.

## 2020-08-23 NOTE — Assessment & Plan Note (Signed)
YDS:WVTVNRWCH on amlodipine, ambulatory BPs are all in the 120s over 60s.  Check a CMP.  No change Dyslipidemia: Her cholesterol levels are okay but her CV RF @ 10 years is somewhat elevated, mostly due to age. In the last 12 months she has completely changed how she eats, exercises 24 days out of a month on average, has lost approximately 12 pounds,  Rode 1100 miles on her  stationary bike ! We will recheck a FLP but probably not to start statins. Preventive care: Had her COVID vaccine x3 and flu shot. Due for a DEXA, as above she is active, taking calcium and vitamin D. RTC 6 to 8 months

## 2020-10-06 ENCOUNTER — Other Ambulatory Visit (HOSPITAL_BASED_OUTPATIENT_CLINIC_OR_DEPARTMENT_OTHER): Payer: Medicare Other

## 2020-10-20 ENCOUNTER — Other Ambulatory Visit (HOSPITAL_BASED_OUTPATIENT_CLINIC_OR_DEPARTMENT_OTHER): Payer: Medicare Other

## 2020-10-31 ENCOUNTER — Other Ambulatory Visit (HOSPITAL_BASED_OUTPATIENT_CLINIC_OR_DEPARTMENT_OTHER): Payer: Self-pay | Admitting: Internal Medicine

## 2020-10-31 DIAGNOSIS — Z1231 Encounter for screening mammogram for malignant neoplasm of breast: Secondary | ICD-10-CM

## 2020-11-17 ENCOUNTER — Other Ambulatory Visit (HOSPITAL_BASED_OUTPATIENT_CLINIC_OR_DEPARTMENT_OTHER): Payer: Medicare Other

## 2020-11-17 ENCOUNTER — Ambulatory Visit (HOSPITAL_BASED_OUTPATIENT_CLINIC_OR_DEPARTMENT_OTHER): Payer: Medicare Other

## 2020-12-02 ENCOUNTER — Ambulatory Visit (HOSPITAL_BASED_OUTPATIENT_CLINIC_OR_DEPARTMENT_OTHER)
Admission: RE | Admit: 2020-12-02 | Discharge: 2020-12-02 | Disposition: A | Discharge status: Home / Self Care | Payer: Medicare Other | Source: Ambulatory Visit | Attending: Internal Medicine | Admitting: Internal Medicine

## 2020-12-02 ENCOUNTER — Encounter (HOSPITAL_BASED_OUTPATIENT_CLINIC_OR_DEPARTMENT_OTHER): Payer: Self-pay

## 2020-12-02 ENCOUNTER — Other Ambulatory Visit: Payer: Self-pay

## 2020-12-02 DIAGNOSIS — M8589 Other specified disorders of bone density and structure, multiple sites: Secondary | ICD-10-CM | POA: Diagnosis not present

## 2020-12-02 DIAGNOSIS — Z1231 Encounter for screening mammogram for malignant neoplasm of breast: Secondary | ICD-10-CM | POA: Insufficient documentation

## 2020-12-02 DIAGNOSIS — Z780 Asymptomatic menopausal state: Secondary | ICD-10-CM | POA: Insufficient documentation

## 2021-01-13 DIAGNOSIS — Z23 Encounter for immunization: Secondary | ICD-10-CM | POA: Diagnosis not present

## 2021-01-14 ENCOUNTER — Telehealth: Payer: Self-pay

## 2021-01-14 NOTE — Telephone Encounter (Signed)
Pt called to advise she received her 2nd [employer], from [facility] on [address] on 01/13/21.  Please update in pt's chart.

## 2021-02-09 ENCOUNTER — Other Ambulatory Visit: Payer: Self-pay | Admitting: Internal Medicine

## 2021-02-20 ENCOUNTER — Ambulatory Visit: Payer: Medicare Other | Admitting: Internal Medicine

## 2021-03-19 ENCOUNTER — Ambulatory Visit (INDEPENDENT_AMBULATORY_CARE_PROVIDER_SITE_OTHER): Payer: Medicare Other

## 2021-03-19 VITALS — Ht 64.0 in | Wt 150.0 lb

## 2021-03-19 DIAGNOSIS — Z0000 Encounter for general adult medical examination without abnormal findings: Secondary | ICD-10-CM | POA: Diagnosis not present

## 2021-03-19 NOTE — Patient Instructions (Signed)
Kelly Schneider , Thank you for taking time to complete your Medicare Wellness Visit. I appreciate your ongoing commitment to your health goals. Please review the following plan we discussed and let me know if I can assist you in the future.   Screening recommendations/referrals: Colonoscopy: Completed 07/13/2016-Due 07/13/2026 Mammogram: Completed 12/02/2020-Due 12/02/2021 Bone Density: Completed 12/02/2020-Due 12/03/2022 Recommended yearly ophthalmology/optometry visit for glaucoma screening and checkup Recommended yearly dental visit for hygiene and checkup  Vaccinations: Influenza vaccine: Up to date Pneumococcal vaccine: Up to date Tdap vaccine: Up to date-Due-04/14/2025 Shingles vaccine: Per our conversation, you will check your records to see if you have had the vaccine. Covid-19:Up to date  Advanced directives: Copy in chart  Conditions/risks identified: See problem list  Next appointment: Follow up in one year for your annual wellness visit    Preventive Care 72 Years and Older, Female Preventive care refers to lifestyle choices and visits with your health care provider that can promote health and wellness. What does preventive care include? A yearly physical exam. This is also called an annual well check. Dental exams once or twice a year. Routine eye exams. Ask your health care provider how often you should have your eyes checked. Personal lifestyle choices, including: Daily care of your teeth and gums. Regular physical activity. Eating a healthy diet. Avoiding tobacco and drug use. Limiting alcohol use. Practicing safe sex. Taking low-dose aspirin every day. Taking vitamin and mineral supplements as recommended by your health care provider. What happens during an annual well check? The services and screenings done by your health care provider during your annual well check will depend on your age, overall health, lifestyle risk factors, and family history of  disease. Counseling  Your health care provider may ask you questions about your: Alcohol use. Tobacco use. Drug use. Emotional well-being. Home and relationship well-being. Sexual activity. Eating habits. History of falls. Memory and ability to understand (cognition). Work and work Statistician. Reproductive health. Screening  You may have the following tests or measurements: Height, weight, and BMI. Blood pressure. Lipid and cholesterol levels. These may be checked every 5 years, or more frequently if you are over 35 years old. Skin check. Lung cancer screening. You may have this screening every year starting at age 72 if you have a 30-pack-year history of smoking and currently smoke or have quit within the past 15 years. Fecal occult blood test (FOBT) of the stool. You may have this test every year starting at age 72. Flexible sigmoidoscopy or colonoscopy. You may have a sigmoidoscopy every 5 years or a colonoscopy every 10 years starting at age 72. Hepatitis C blood test. Hepatitis B blood test. Sexually transmitted disease (STD) testing. Diabetes screening. This is done by checking your blood sugar (glucose) after you have not eaten for a while (fasting). You may have this done every 1-3 years. Bone density scan. This is done to screen for osteoporosis. You may have this done starting at age 72. Mammogram. This may be done every 1-2 years. Talk to your health care provider about how often you should have regular mammograms. Talk with your health care provider about your test results, treatment options, and if necessary, the need for more tests. Vaccines  Your health care provider may recommend certain vaccines, such as: Influenza vaccine. This is recommended every year. Tetanus, diphtheria, and acellular pertussis (Tdap, Td) vaccine. You may need a Td booster every 10 years. Zoster vaccine. You may need this after age 41. Pneumococcal 13-valent conjugate (PCV13) vaccine.  One  dose is recommended after age 72. Pneumococcal polysaccharide (PPSV23) vaccine. One dose is recommended after age 72. Talk to your health care provider about which screenings and vaccines you need and how often you need them. This information is not intended to replace advice given to you by your health care provider. Make sure you discuss any questions you have with your health care provider. Document Released: 09/26/2015 Document Revised: 05/19/2016 Document Reviewed: 07/01/2015 Elsevier Interactive Patient Education  2017 [place] Prevention in the Home Falls can cause injuries. They can happen to people of all ages. There are many things you can do to make your home safe and to help prevent falls. What can I do on the outside of my home? Regularly fix the edges of walkways and driveways and fix any cracks. Remove anything that might make you trip as you walk through a door, such as a raised step or threshold. Trim any bushes or trees on the path to your home. Use bright outdoor lighting. Clear any walking paths of anything that might make someone trip, such as rocks or tools. Regularly check to see if handrails are loose or broken. Make sure that both sides of any steps have handrails. Any raised decks and porches should have guardrails on the edges. Have any leaves, snow, or ice cleared regularly. Use sand or salt on walking paths during winter. Clean up any spills in your garage right away. This includes oil or grease spills. What can I do in the bathroom? Use night lights. Install grab bars by the toilet and in the tub and shower. Do not use towel bars as grab bars. Use non-skid mats or decals in the tub or shower. If you need to sit down in the shower, use a plastic, non-slip stool. Keep the floor dry. Clean up any water that spills on the floor as soon as it happens. Remove soap buildup in the tub or shower regularly. Attach bath mats securely with double-sided  non-slip rug tape. Do not have throw rugs and other things on the floor that can make you trip. What can I do in the bedroom? Use night lights. Make sure that you have a light by your bed that is easy to reach. Do not use any sheets or blankets that are too big for your bed. They should not hang down onto the floor. Have a firm chair that has side arms. You can use this for support while you get dressed. Do not have throw rugs and other things on the floor that can make you trip. What can I do in the kitchen? Clean up any spills right away. Avoid walking on wet floors. Keep items that you use a lot in easy-to-reach places. If you need to reach something above you, use a strong step stool that has a grab bar. Keep electrical cords out of the way. Do not use floor polish or wax that makes floors slippery. If you must use wax, use non-skid floor wax. Do not have throw rugs and other things on the floor that can make you trip. What can I do with my stairs? Do not leave any items on the stairs. Make sure that there are handrails on both sides of the stairs and use them. Fix handrails that are broken or loose. Make sure that handrails are as long as the stairways. Check any carpeting to make sure that it is firmly attached to the stairs. Fix any carpet that is loose or  worn. Avoid having throw rugs at the top or bottom of the stairs. If you do have throw rugs, attach them to the floor with carpet tape. Make sure that you have a light switch at the top of the stairs and the bottom of the stairs. If you do not have them, ask someone to add them for you. What else can I do to help prevent falls? Wear shoes that: Do not have high heels. Have rubber bottoms. Are comfortable and fit you well. Are closed at the toe. Do not wear sandals. If you use a stepladder: Make sure that it is fully opened. Do not climb a closed stepladder. Make sure that both sides of the stepladder are locked into place. Ask  someone to hold it for you, if possible. Clearly mark and make sure that you can see: Any grab bars or handrails. First and last steps. Where the edge of each step is. Use tools that help you move around (mobility aids) if they are needed. These include: Canes. Walkers. Scooters. Crutches. Turn on the lights when you go into a dark area. Replace any light bulbs as soon as they burn out. Set up your furniture so you have a clear path. Avoid moving your furniture around. If any of your floors are uneven, fix them. If there are any pets around you, be aware of where they are. Review your medicines with your doctor. Some medicines can make you feel dizzy. This can increase your chance of falling. Ask your doctor what other things that you can do to help prevent falls. This information is not intended to replace advice given to you by your health care provider. Make sure you discuss any questions you have with your health care provider. Document Released: 06/26/2009 Document Revised: 02/05/2016 Document Reviewed: 10/04/2014 Elsevier Interactive Patient Education  2017 [employer].

## 2021-03-19 NOTE — Progress Notes (Signed)
Subjective:   Kelly Schneider is a 70 y.o. female who presents for Medicare Annual (Subsequent) preventive examination.  I connected with [name] today by telephone and verified that I am speaking with the correct person using two identifiers. Location patient: home Location provider: work Persons participating in the virtual visit: patient, Marine scientist.    I discussed the limitations, risks, security and privacy concerns of performing an evaluation and management service by telephone and the availability of in person appointments. I also discussed with the patient that there may be a patient responsible charge related to this service. The patient expressed understanding and verbally consented to this telephonic visit.    Interactive audio and video telecommunications were attempted between this provider and patient, however failed, due to patient having technical difficulties OR patient did not have access to video capability.  We continued and completed visit with audio only.  Some vital signs may be absent or patient reported.   Time Spent with patient on telephone encounter: 30 minutes   Review of Systems     Cardiac Risk Factors include: advanced age (>64men, >76 women);dyslipidemia;hypertension     Objective:    Today's Vitals   03/19/21 1500  Weight: 150 lb (68 kg)  Height: 5\' 4"  (1.626 m)   Body mass index is 25.75 kg/m.  Advanced Directives 03/19/2021 11/06/2019 07/13/2016  Does Patient Have a Medical Advance Directive? Yes Yes Yes  Type of Paramedic of [place];Living will [place];Living will -  Does patient want to make changes to medical advance directive? - No - Patient declined -  Copy of [place] in Chart? Yes - validated most recent copy scanned in chart (See row information) No - copy requested -    Current Medications (verified) Outpatient Encounter Medications as of 03/19/2021  Medication Sig    amLODipine (NORVASC) 5 MG tablet Take 1 tablet (5 mg total) by mouth daily.   Ascorbic Acid (VITAMIN C PO) Take 375 mg by mouth daily.   aspirin EC 81 MG tablet Take 81 mg by mouth daily.   Calcium-Vitamin D-Vitamin K (VIACTIV PO) Take by mouth. Viactive chews-Take 1 pill bid   cycloSPORINE (RESTASIS) 0.05 % ophthalmic emulsion Place 1 drop into both eyes 2 (two) times daily.   DHA-EPA-Flaxseed Oil-Vitamin E (THERA TEARS NUTRITION PO) Take by mouth. Take 2 capsules daily   estradiol (ESTRACE) 1 MG tablet Take 1 mg by mouth daily.   Multiple Vitamins-Minerals (ONE-A-DAY 50 PLUS PO) Take by mouth daily.   Potassium (POTASSIMIN PO) Take 550 mg by mouth daily.   TURMERIC PO Take 1 tablet by mouth daily.   No facility-administered encounter medications on file as of 03/19/2021.    Allergies (verified) Penicillins   History: Past Medical History:  Diagnosis Date   Actinic keratosis    Right superior central posterior tibial shave, Dr. Fontaine No   Anxiety    Pt does not like anything in her nose.   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   Depression    h/o   Dry eyes    Hx of endometriosis    Hx of migraines    Ovarian cyst    69 years old   Ulcer    19 y o   Past Surgical History:  Procedure Laterality Date   [place]  For Endometriosis   RIGHT OOPHORECTOMY  1970   right tube removed   TONSILLECTOMY     Family History  Problem Relation Age of Onset   CAD Other        GM, uncle   Diabetes Other        mother side    Colon cancer Neg Hx    Breast cancer Neg Hx    Social History   Socioeconomic History   Marital status: Married    Spouse name: Not on file   Number of children: 0   Years of education: Not on file   Highest education level: Not on file  Occupational History   Occupation: retired  Tobacco Use   Smoking status: Never    Smokeless tobacco: Never  Substance and Sexual Activity   Alcohol use: Yes    Alcohol/week: 7.0 standard drinks    Types: 7 Glasses of wine per week   Drug use: No   Sexual activity: Not on file  Other Topics Concern   Not on file  Social History Narrative   Married w/ Optician, dispensing    Social Determinants of Health   Financial Resource Strain: Low Risk    Difficulty of Paying Living Expenses: Not hard at all  Food Insecurity: No Food Insecurity   Worried About Charity fundraiser in the Last Year: Never true   Arboriculturist in the Last Year: Never true  Transportation Needs: No Transportation Needs   Lack of Transportation (Medical): No   Lack of Transportation (Non-Medical): No  Physical Activity: Sufficiently Active   Days of Exercise per Week: 5 days   Minutes of Exercise per Session: 30 min  Stress: No Stress Concern Present   Feeling of Stress : Only a little  Social Connections: Moderately Isolated   Frequency of Communication with Friends and Family: More than three times a week   Frequency of Social Gatherings with Friends and Family: Once a week   Attends Religious Services: Never   Marine scientist or Organizations: No   Attends Music therapist: Never   Marital Status: Married    Tobacco Counseling Counseling given: Not Answered   Clinical Intake:  Pre-visit preparation completed: Yes        Nutritional Status: BMI 25 -29 Overweight Nutritional Risks: None Diabetes: No  How often do you need to have someone help you when you read instructions, pamphlets, or other written materials from your doctor or pharmacy?: 1 - Never  Diabetic?No  Interpreter Needed?: No  Information entered by :: Caroleen Hamman LPN   Activities of Daily Living In your present state of health, do you have any difficulty performing the following activities: 03/19/2021  Hearing? N  Vision? N  Difficulty concentrating or making decisions? N  Walking or climbing  stairs? N  Dressing or bathing? N  Doing errands, shopping? N  Preparing Food and eating ? N  Using the Toilet? N  In the past six months, have you accidently leaked urine? Y  Comment occasionally  Do you have problems with loss of bowel control? N  Managing your Medications? N  Managing your Finances? N  Housekeeping or managing your Housekeeping? N  Some recent data might be hidden    Patient Care Team: Colon Branch, MD as PCP - Anson Fret, MD as Consulting Physician (Dermatology) Bobbye Charleston, MD as Consulting Physician (Obstetrics and Gynecology)  Indicate any recent Medical Services you may have received from  other than [facility] providers in the past year (date may be approximate).     Assessment:   This is a routine wellness examination for [place].  Hearing/Vision screen Hearing Screening - Comments:: No issues Vision Screening - Comments:: Wears glasses Last eye exam-05/2020-Dr. Eulas Post  Dietary issues and exercise activities discussed: Current Exercise Habits: Home exercise routine, Type of exercise: Other - see comments;strength training/weights (stationary bike, yard work, trampoline, stepper), Time (Minutes): 30, Frequency (Times/Week): 5, Weekly Exercise (Minutes/Week): 150, Intensity: Mild, Exercise limited by: None identified   Goals Addressed             This Visit's Progress    Maintain healthy active lifestyle.   On track      Depression Screen PHQ 2/9 Scores 03/19/2021 11/06/2019 08/21/2019 07/23/2016 03/18/2015  PHQ - 2 Score 0 0 0 0 0    Fall Risk Fall Risk  03/19/2021 11/06/2019 08/21/2019 08/02/2018 04/12/2018  Falls in the past year? 0 0 1 1 Yes  Comment - - - [facility] Telephone Survey: data to providers prior to load [employer] Telephone Survey: data to providers prior to load  Number falls in past yr: 0 0 0 1 1  Comment - - - [facility] Telephone Survey Actual Response = 1 [facility] Telephone Survey Actual Response = 1  Injury with Fall? 0 0 0 1 Yes   Follow up Falls prevention discussed Education provided;Falls prevention discussed Falls evaluation completed - -    FALL RISK PREVENTION PERTAINING TO THE HOME: Any stairs in or around your home? No Home free of loose throw rugs in walkways, pet beds, electrical cords, etc? Yes  Adequate lighting in your home to reduce risk of falls? Yes   ASSISTIVE DEVICES UTILIZED TO PREVENT FALLS:  Life alert? No  Use of a cane, walker or w/c? No  Grab bars in the bathroom? No  Shower chair or bench in shower? Yes  Elevated toilet seat or a handicapped toilet? No   TIMED UP AND GO:  Was the test performed? No . Phone visit   Cognitive Function:Normal cognitive status assessed by  this Nurse Health Advisor. No abnormalities found.          Immunizations Immunization History  Administered Date(s) Administered   Influenza Split 07/01/2018, 05/29/2019   Influenza Whole 10/09/2010   Influenza, High Dose Seasonal PF 07/23/2016   Influenza-Unspecified 08/12/2014, 07/15/2015, 06/25/2020   PFIZER(Purple Top)SARS-COV-2 Vaccination 11/22/2019, 12/13/2019, 07/01/2020, 01/13/2021   Pneumococcal Conjugate-13 04/15/2015   Pneumococcal Polysaccharide-23 07/23/2016   Tdap 04/15/2015   Zoster, Live 05/05/2010    TDAP status: Up to date  Flu Vaccine status: Up to date  Pneumococcal vaccine status: Up to date  Covid-19 vaccine status: Completed vaccines  Qualifies for Shingles Vaccine? Yes   Zostavax completed Yes   Shingrix Completed?: No.    Education has been provided regarding the importance of this vaccine. Patient has been advised to call insurance company to determine out of pocket expense if they have not yet received this vaccine. Advised may also receive vaccine at local pharmacy or Health Dept. Verbalized acceptance and understanding.  Screening Tests Health Maintenance  Topic Date Due   Zoster Vaccines- Shingrix (1 of 2) Never done   INFLUENZA VACCINE  04/13/2021   COVID-19  Vaccine (5 - Booster for Pfizer series) 05/16/2021   MAMMOGRAM  12/02/2021   TETANUS/TDAP  04/14/2025   COLONOSCOPY (Pts 45-74yrs Insurance coverage will need to be confirmed)  07/13/2026   DEXA SCAN  Completed   Hepatitis  C Screening  Completed   PNA vac Low Risk Adult  Completed   HPV VACCINES  Aged Out    Health Maintenance  Health Maintenance Due  Topic Date Due   Zoster Vaccines- Shingrix (1 of 2) Never done    Colorectal cancer screening: Type of screening: Colonoscopy. Completed 07/13/2016. Repeat every 10 years  Mammogram status: Completed Bilateral 12/02/2020. Repeat every year  Bone Density status: Completed 12/02/2020. Results reflect: Bone density results: NORMAL. Repeat every 2 years.  Lung Cancer Screening: (Low Dose CT Chest recommended if Age 28-80 years, 30 pack-year currently smoking OR have quit w/in 15years.) does not qualify.     Additional Screening:  Hepatitis C Screening:  Completed 08/03/2016  Vision Screening: Recommended annual ophthalmology exams for early detection of glaucoma and other disorders of the eye. Is the patient up to date with their annual eye exam?  Yes  Who is the provider or what is the name of the office in which the patient attends annual eye exams? Dr. Eulas Post   Dental Screening: Recommended annual dental exams for proper oral hygiene  Community Resource Referral / Chronic Care Management: CRR required this visit?  No   CCM required this visit?  No      Plan:     I have personally reviewed and noted the following in the patient's chart:   Medical and social history Use of alcohol, tobacco or illicit drugs  Current medications and supplements including opioid prescriptions.  Functional ability and status Nutritional status Physical activity Advanced directives List of other physicians Hospitalizations, surgeries, and ER visits in previous 12 months Vitals Screenings to include cognitive, depression, and  falls Referrals and appointments  In addition, I have reviewed and discussed with patient certain preventive protocols, quality metrics, and best practice recommendations. A written personalized care plan for preventive services as well as general preventive health recommendations were provided to patient.   Due to this being a telephonic visit, the after visit summary with patients personalized plan was offered to patient via mail or my-chart. Patient would like to access on my-chart.   Marta Antu, LPN   03/18/7208  Nurse Health Advisor  Nurse Notes: None

## 2021-05-08 ENCOUNTER — Ambulatory Visit: Payer: Medicare Other | Admitting: Internal Medicine

## 2021-06-03 ENCOUNTER — Ambulatory Visit: Payer: Medicare Other | Admitting: Internal Medicine

## 2021-06-17 ENCOUNTER — Ambulatory Visit (INDEPENDENT_AMBULATORY_CARE_PROVIDER_SITE_OTHER): Payer: Medicare Other | Admitting: Internal Medicine

## 2021-06-17 ENCOUNTER — Encounter: Payer: Self-pay | Admitting: Internal Medicine

## 2021-06-17 ENCOUNTER — Other Ambulatory Visit: Payer: Self-pay

## 2021-06-17 VITALS — BP 162/78 | HR 78 | Temp 97.9°F | Resp 16 | Ht 64.0 in | Wt 155.0 lb

## 2021-06-17 DIAGNOSIS — E785 Hyperlipidemia, unspecified: Secondary | ICD-10-CM | POA: Diagnosis not present

## 2021-06-17 DIAGNOSIS — Z23 Encounter for immunization: Secondary | ICD-10-CM

## 2021-06-17 DIAGNOSIS — I10 Essential (primary) hypertension: Secondary | ICD-10-CM | POA: Diagnosis not present

## 2021-06-17 NOTE — Patient Instructions (Signed)
Proceed with your COVID booster at your convenience  Proceed with a shingles vaccine called Shingrix at your pharmacy  Check the  blood pressure regularly  BP GOAL is between 110/65 and  135/85. If it is consistently higher or lower, let me know    GO TO THE LAB : Get the blood work     [place], [place] back for checkup in 6 months

## 2021-06-17 NOTE — Progress Notes (Signed)
Subjective:    Patient ID: Kelly Schneider, female    DOB: [birth year], 70 y.o.   MRN: [id number]  DOS:  06/17/2021 Type of visit - description: Follow-up  Since the last office visit is doing well. We reviewed together her ambulatory BPs, heart rate.  Review of Systems She is stays extremely active. Denies chest pain, difficulty breathing or edema  Past Medical History:  Diagnosis Date   Actinic keratosis    Right superior central posterior tibial shave, Dr. Fontaine No   Anxiety    Pt does not like anything in her nose.   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   Depression    h/o   Dry eyes    Hx of endometriosis    Hx of migraines    Ovarian cyst    70 years old   Ulcer    30 y o    Past Surgical History:  Procedure Laterality Date   ABDOMINAL HYSTERECTOMY  1987   Total   [place]   For Endometriosis   RIGHT OOPHORECTOMY  1970   right tube removed   TONSILLECTOMY      Allergies as of 06/17/2021       Reactions   Penicillins    REACTION: nausea        Medication List        Accurate as of June 17, 2021 11:59 PM. If you have any questions, ask your nurse or doctor.          amLODipine 5 MG tablet Commonly known as: NORVASC Take 1 tablet (5 mg total) by mouth daily.   aspirin EC 81 MG tablet Take 81 mg by mouth daily.   cycloSPORINE 0.05 % ophthalmic emulsion Commonly known as: RESTASIS Place 1 drop into both eyes 2 (two) times daily.   estradiol 1 MG tablet Commonly known as: ESTRACE Take 1 mg by mouth daily.   ONE-A-DAY 50 PLUS PO Take by mouth daily.   POTASSIMIN PO Take 550 mg by mouth daily.   THERA TEARS NUTRITION PO Take by mouth. Take 2 capsules daily   TURMERIC PO Take 1 tablet by mouth daily.   VIACTIV PO Take by mouth. Viactive chews-Take 1 pill bid   VITAMIN C PO Take 375 mg by mouth daily.           Objective:    Physical Exam BP (!) 162/78 (BP Location: Left Arm, Patient Position: Sitting, Cuff Size: Small)   Pulse 78   Temp 97.9 F (36.6 C) (Oral)   Resp 16   Ht 5\' 4"  (1.626 m)   Wt 155 lb (70.3 kg)   SpO2 98%   BMI 26.61 kg/m  General:   Well developed, NAD, BMI noted. HEENT:  Normocephalic . Face symmetric, atraumatic Lungs:  CTA B Normal respiratory effort, no intercostal retractions, no accessory muscle use. Heart: RRR,  no murmur.  Lower extremities: no pretibial edema bilaterally  Skin: Not pale. Not jaundice Neurologic:  alert & oriented X3.  Speech normal, gait appropriate for age and unassisted Psych--  Cognition and judgment appear intact.  Cooperative with normal attention span and concentration.  Behavior appropriate. No anxious or depressed appearing.      Assessment     ASSESSMENT HTN H/o Anxiety depression Osteopenia: DEXA normal 2016 and 11/2020  Actinic keratosis History of migraines Dry eyes  DOE: Low risk Myoview 12/30-20 20  PLAN HTN: On amlodipine.  No edema.  BP today slightly elevated but at home is consistently in the 120s over 80, heart rate 68.  No change, check a BMP and CBC. Dyslipidemia: LDL decreased from 72 to 65 when we checked in December 2021.  Decrease was due to a very good lifestyle.  Still doing the stationary bike, this year has done 996 miles.    Osteopenia?  DEXA normal in 2016 on 11-2020. Preventive care reviewed RTC 6 months   This visit occurred during the SARS-CoV-2 public health emergency.  Safety protocols were in place, including screening questions prior to the visit, additional usage of staff PPE, and extensive cleaning of exam room while observing appropriate contact time as indicated for disinfecting solutions.

## 2021-06-18 LAB — CBC WITH DIFFERENTIAL/PLATELET
Basophils Absolute: 0.1 10*3/uL (ref 0.0–0.1)
Basophils Relative: 1 % (ref 0.0–3.0)
Eosinophils Absolute: 0.1 10*3/uL (ref 0.0–0.7)
Eosinophils Relative: 1.7 % (ref 0.0–5.0)
HCT: 40.6 % (ref 36.0–46.0)
Hemoglobin: 13.8 g/dL (ref 12.0–15.0)
Lymphocytes Relative: 29.9 % (ref 12.0–46.0)
Lymphs Abs: 2 10*3/uL (ref 0.7–4.0)
MCHC: 33.9 g/dL (ref 30.0–36.0)
MCV: 89 fl (ref 78.0–100.0)
Monocytes Absolute: 0.5 10*3/uL (ref 0.1–1.0)
Monocytes Relative: 8.1 % (ref 3.0–12.0)
Neutro Abs: 3.9 10*3/uL (ref 1.4–7.7)
Neutrophils Relative %: 59.3 % (ref 43.0–77.0)
Platelets: 248 10*3/uL (ref 150.0–400.0)
RBC: 4.56 Mil/uL (ref 3.87–5.11)
RDW: 13.5 % (ref 11.5–15.5)
WBC: 6.6 10*3/uL (ref 4.0–10.5)

## 2021-06-18 LAB — BASIC METABOLIC PANEL
BUN: 13 mg/dL (ref 6–23)
CO2: 27 mEq/L (ref 19–32)
Calcium: 9.1 mg/dL (ref 8.4–10.5)
Chloride: 101 mEq/L (ref 96–112)
Creatinine, Ser: 0.84 mg/dL (ref 0.40–1.20)
GFR: 69.28 mL/min (ref >60.00)
Glucose, Bld: 100 mg/dL — ABNORMAL HIGH (ref 70–99)
Potassium: 3.9 mEq/L (ref 3.5–5.1)
Sodium: 137 mEq/L (ref 135–145)

## 2021-06-19 ENCOUNTER — Telehealth: Payer: Self-pay | Admitting: Internal Medicine

## 2021-06-19 DIAGNOSIS — E785 Hyperlipidemia, unspecified: Secondary | ICD-10-CM

## 2021-06-19 NOTE — Assessment & Plan Note (Signed)
HTN: On amlodipine.  No edema.  BP today slightly elevated but at home is consistently in the 120s over 80, heart rate 68.  No change, check a BMP and CBC. Dyslipidemia: LDL decreased from 72 to 65 when we checked in December 2021.  Decrease was due to a very good lifestyle.  Still doing the stationary bike, this year has done 996 miles.    Osteopenia?  DEXA normal in 2016 on 11-2020. Preventive care reviewed RTC 6 months

## 2021-06-19 NOTE — Assessment & Plan Note (Signed)
Preventive care Td 2016;  prevnar 2016; pnm 23 -07-2016 Zoster 2011, rec Shingrix COVID vaccines x4, recommend booster Flu shot today CCS: last cscope 06-2016 (-), Dr Carlean Purl , 70 years Female care: Had a Pap smear 2020, to see gyn next month Mammogram 12/02/2020 [place] (-) AAA 04-2015 doing great with lifestyle Advance directives on file

## 2021-06-19 NOTE — Telephone Encounter (Signed)
Add on faxed to [facility].

## 2021-06-19 NOTE — Telephone Encounter (Signed)
Advise patient, we will try to add to her recent labs; if unable to, she can come back for a redraw

## 2021-06-19 NOTE — Telephone Encounter (Signed)
Patient called wanting to know if it was possible for her to have cholesterol labs done. Please advise.

## 2021-06-19 NOTE — Telephone Encounter (Signed)
Please advise 

## 2021-06-22 ENCOUNTER — Other Ambulatory Visit (INDEPENDENT_AMBULATORY_CARE_PROVIDER_SITE_OTHER): Payer: Medicare Other

## 2021-06-22 DIAGNOSIS — E785 Hyperlipidemia, unspecified: Secondary | ICD-10-CM | POA: Diagnosis not present

## 2021-06-22 LAB — LIPID PANEL
Cholesterol: 190 mg/dL (ref 0–200)
HDL: 106.2 mg/dL (ref >39.00)
LDL Cholesterol: 60 mg/dL (ref 0–99)
NonHDL: 84.1
Total CHOL/HDL Ratio: 2
Triglycerides: 123 mg/dL (ref 0.0–149.0)
VLDL: 24.6 mg/dL (ref 0.0–40.0)

## 2021-06-26 ENCOUNTER — Ambulatory Visit: Payer: Medicare Other | Attending: Internal Medicine

## 2021-06-26 DIAGNOSIS — Z23 Encounter for immunization: Secondary | ICD-10-CM

## 2021-06-26 NOTE — Progress Notes (Signed)
   Covid-19 Vaccination Clinic  Name:  Kelly Schneider    MRN: [id number] DOB: 06/08/1951  06/26/2021  [name] was observed post Covid-19 immunization for 15 minutes without incident. She was provided with Vaccine Information Sheet and instruction to access the V-Safe system.   [name] was instructed to call 911 with any severe reactions post vaccine: Difficulty breathing  Swelling of face and throat  A fast heartbeat  A bad rash all over body  Dizziness and weakness

## 2021-06-29 ENCOUNTER — Telehealth: Payer: Self-pay | Admitting: Internal Medicine

## 2021-06-29 NOTE — Telephone Encounter (Signed)
Record updated

## 2021-06-29 NOTE — Telephone Encounter (Signed)
Pt. Wanted to call and make aware that she received her COVID boaster

## 2021-07-10 ENCOUNTER — Telehealth: Payer: Self-pay | Admitting: Internal Medicine

## 2021-07-10 NOTE — Telephone Encounter (Signed)
[facility] has not been updated yet.

## 2021-07-10 NOTE — Telephone Encounter (Signed)
Pt wanted to let you know she got her shingles shot.

## 2021-07-17 ENCOUNTER — Other Ambulatory Visit (HOSPITAL_BASED_OUTPATIENT_CLINIC_OR_DEPARTMENT_OTHER): Payer: Self-pay

## 2021-07-17 DIAGNOSIS — Z23 Encounter for immunization: Secondary | ICD-10-CM | POA: Diagnosis not present

## 2021-07-17 MED ORDER — PFIZER COVID-19 VAC BIVALENT 30 MCG/0.3ML IM SUSP
INTRAMUSCULAR | 0 refills | Status: DC
  Filled 2021-07-17: qty 0.3, 1d supply, fill #0

## 2021-07-31 DIAGNOSIS — H5213 Myopia, bilateral: Secondary | ICD-10-CM | POA: Diagnosis not present

## 2021-07-31 DIAGNOSIS — H04123 Dry eye syndrome of bilateral lacrimal glands: Secondary | ICD-10-CM | POA: Diagnosis not present

## 2021-07-31 DIAGNOSIS — H2513 Age-related nuclear cataract, bilateral: Secondary | ICD-10-CM | POA: Diagnosis not present

## 2021-07-31 DIAGNOSIS — H02825 Cysts of left lower eyelid: Secondary | ICD-10-CM | POA: Diagnosis not present

## 2021-08-09 ENCOUNTER — Other Ambulatory Visit: Payer: Self-pay | Admitting: Internal Medicine

## 2021-09-16 DIAGNOSIS — U071 COVID-19: Secondary | ICD-10-CM | POA: Diagnosis not present

## 2021-10-13 IMAGING — MG MM DIGITAL SCREENING BILAT W/ TOMO AND CAD
8 series · 8 of 24 positions shown · non-contrast
Comparison: Previous exam(s).

CLINICAL DATA: Screening.

EXAM:
DIGITAL SCREENING BILATERAL MAMMOGRAM WITH TOMOSYNTHESIS AND CAD
TECHNIQUE: Bilateral screening digital craniocaudal and mediolateral oblique
mammograms were obtained. Bilateral screening digital breast
tomosynthesis was performed. The images were evaluated with
computer-aided detection.

[L CC synth-2D]
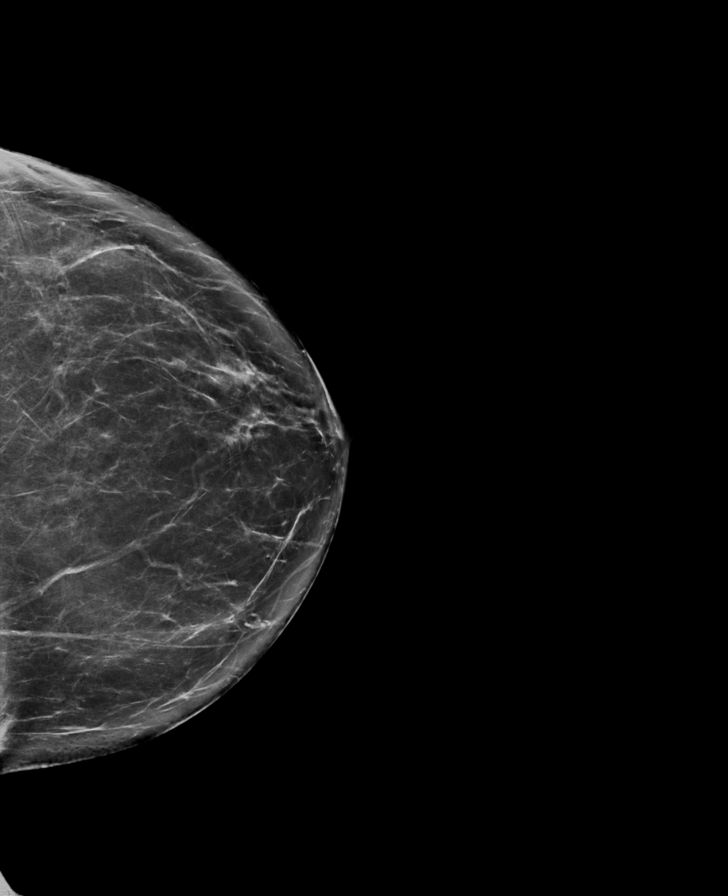

[R CC synth-2D]
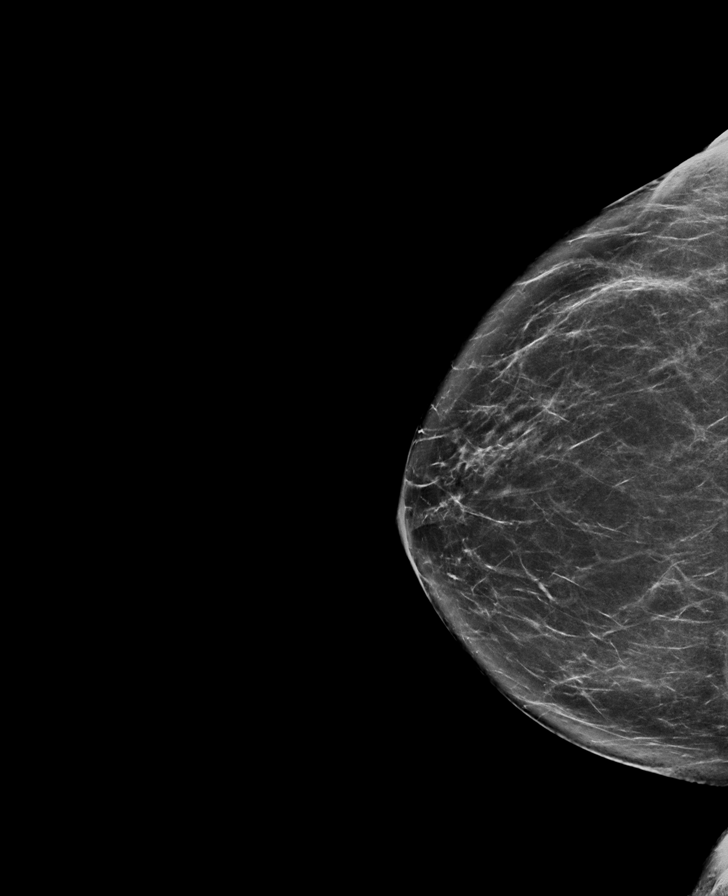

[R MLO synth-2D]
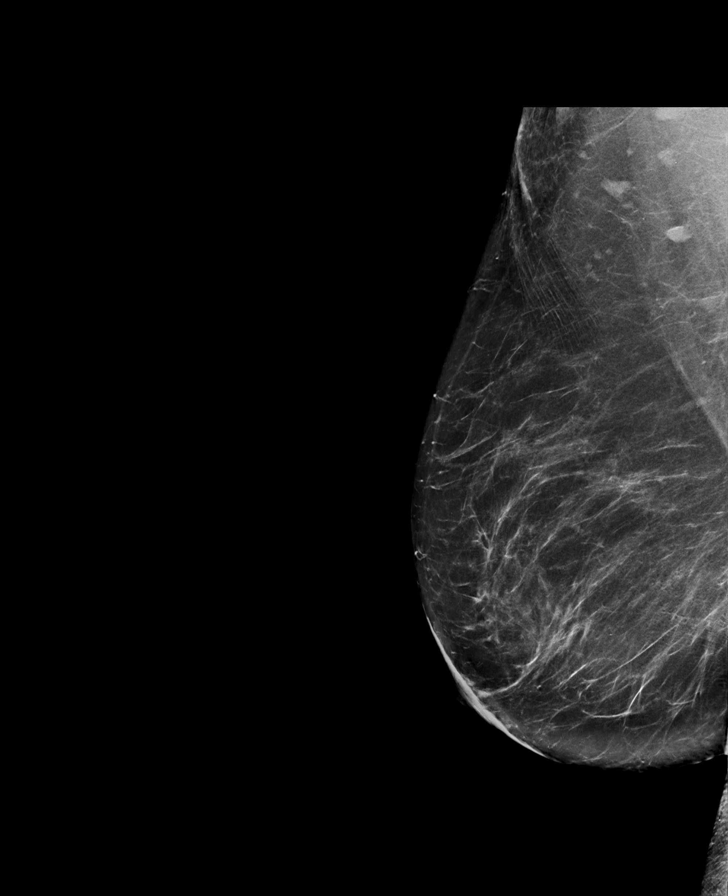

[L MLO synth-2D]
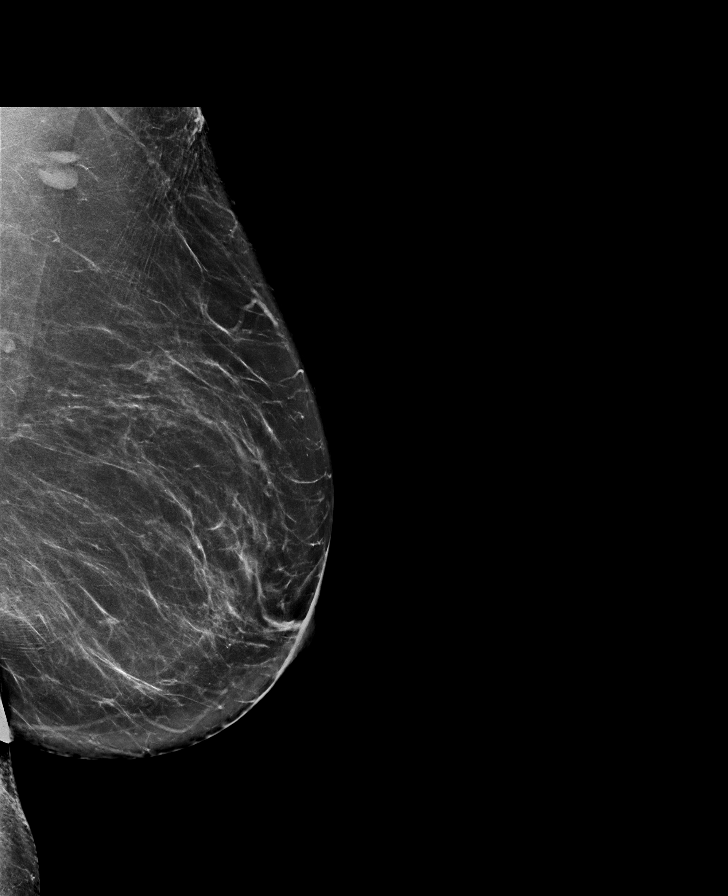

[R MLO tomo · tomo slice 43/84.0]
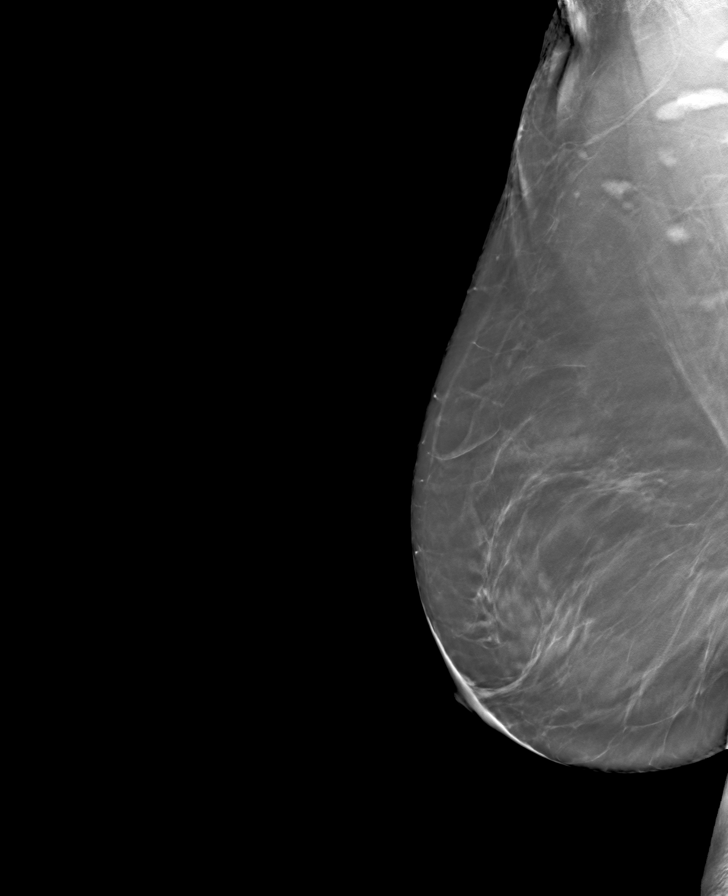

[L MLO tomo · tomo slice 41/81.0]
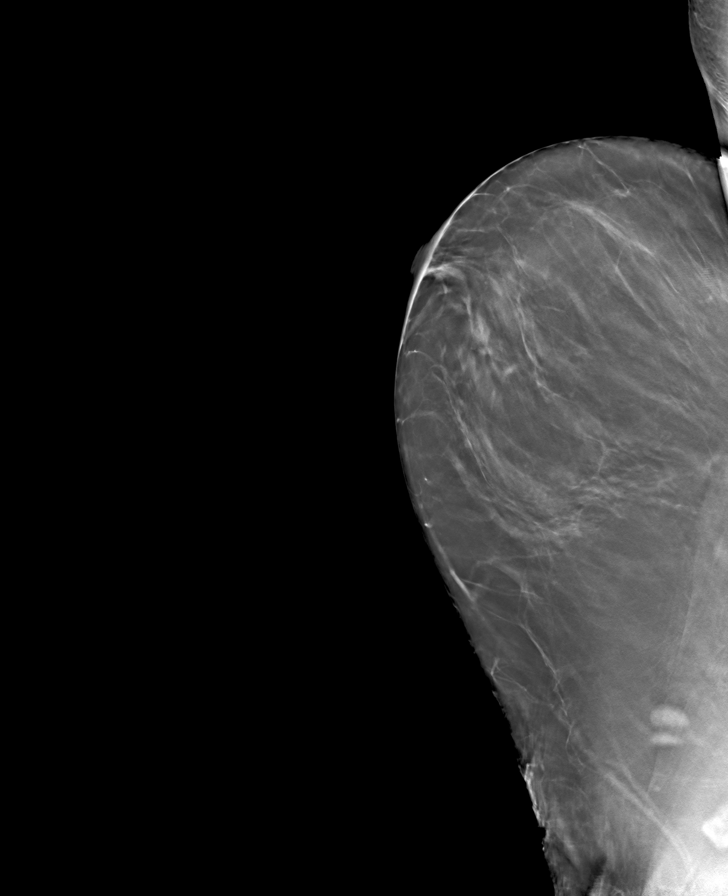

[R CC tomo · tomo slice 37/72.0]
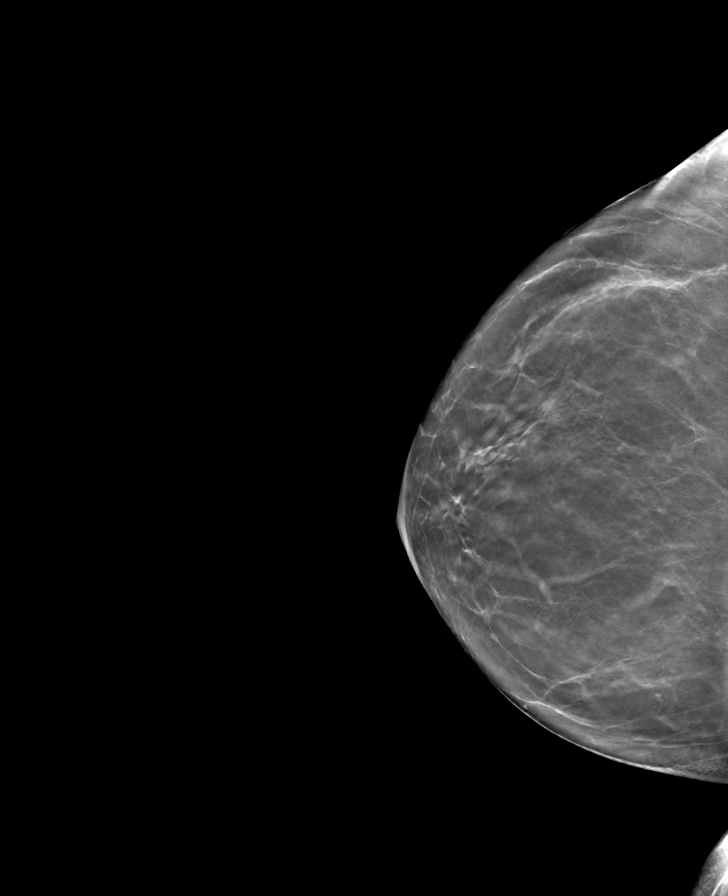

[L CC tomo · tomo slice 37/74.0]
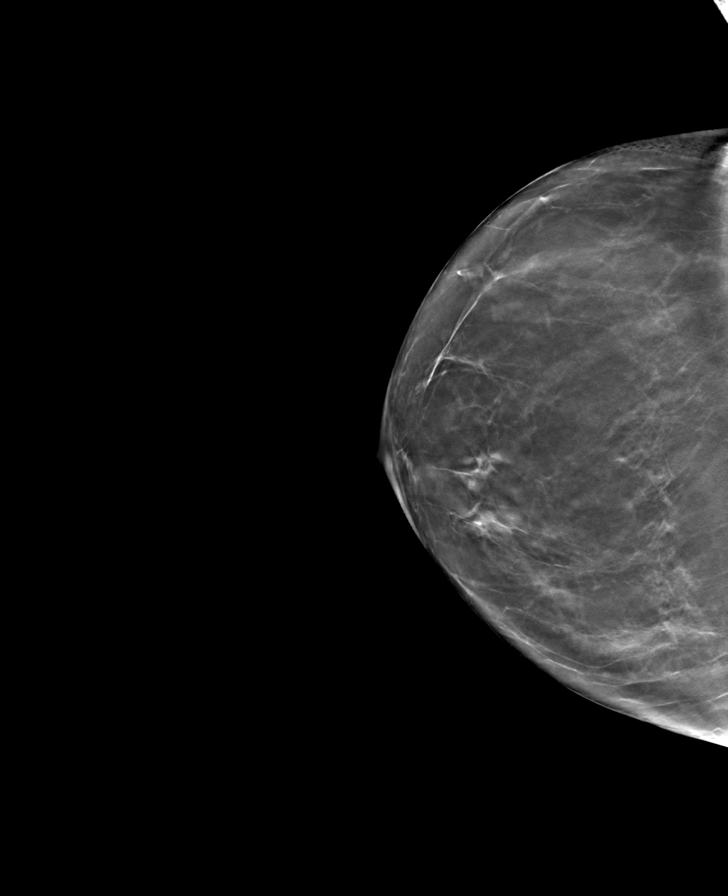

[8 of 24 positions shown; findings below may reference images not displayed]

ACR Breast Density Category b: There are scattered areas of
fibroglandular density.
FINDINGS: There are no findings suspicious for malignancy. The images were
evaluated with computer-aided detection.
IMPRESSION: No mammographic evidence of malignancy. A result letter of this
screening mammogram will be mailed directly to the patient.

RECOMMENDATION:
Screening mammogram in one year. (Code:WJ-[id number])

BI-RADS CATEGORY  1: Negative.

## 2021-12-16 ENCOUNTER — Ambulatory Visit: Payer: Medicare Other | Admitting: Internal Medicine

## 2021-12-16 DIAGNOSIS — Z1231 Encounter for screening mammogram for malignant neoplasm of breast: Secondary | ICD-10-CM | POA: Diagnosis not present

## 2021-12-16 DIAGNOSIS — Z01419 Encounter for gynecological examination (general) (routine) without abnormal findings: Secondary | ICD-10-CM | POA: Diagnosis not present

## 2021-12-16 LAB — HM MAMMOGRAPHY

## 2021-12-30 ENCOUNTER — Encounter: Payer: Self-pay | Admitting: Internal Medicine

## 2021-12-31 DIAGNOSIS — U071 COVID-19: Secondary | ICD-10-CM | POA: Diagnosis not present

## 2022-02-03 ENCOUNTER — Other Ambulatory Visit: Payer: Self-pay | Admitting: Internal Medicine

## 2022-02-03 ENCOUNTER — Ambulatory Visit: Payer: Medicare Other | Admitting: Internal Medicine

## 2022-03-29 ENCOUNTER — Ambulatory Visit (INDEPENDENT_AMBULATORY_CARE_PROVIDER_SITE_OTHER): Payer: Medicare Other

## 2022-03-29 VITALS — Ht 64.0 in | Wt 155.0 lb

## 2022-03-29 DIAGNOSIS — Z0000 Encounter for general adult medical examination without abnormal findings: Secondary | ICD-10-CM | POA: Diagnosis not present

## 2022-03-29 NOTE — Progress Notes (Addendum)
Subjective:   Kelly Schneider is a 71 y.o. female who presents for Medicare Annual (Subsequent) preventive examination.   I connected with Kortney today by telephone and verified that I am speaking with the correct person using two identifiers. Location patient: home Location provider: work Persons participating in the virtual visit: patient, Marine scientist.    I discussed the limitations, risks, security and privacy concerns of performing an evaluation and management service by telephone and the availability of in person appointments. I also discussed with the patient that there may be a patient responsible charge related to this service. The patient expressed understanding and verbally consented to this telephonic visit.    Interactive audio and video telecommunications were attempted between this provider and patient, however failed, due to patient having technical difficulties OR patient did not have access to video capability.  We continued and completed visit with audio only.  Some vital signs may be absent or patient reported.   Time Spent with patient on telephone encounter: 20 minutes  Review of Systems     Cardiac Risk Factors include: advanced age (>18mn, >>65women);dyslipidemia;hypertension     Objective:    Today's Vitals   03/29/22 1401 03/29/22 1402  Weight: 155 lb (70.3 kg)   Height: '5\' 4"'$  (1.626 m)   PainSc:  7    Body mass index is 26.61 kg/m.     03/29/2022    2:06 PM 03/19/2021    3:05 PM 11/06/2019   11:47 AM 07/13/2016   10:10 AM  Advanced Directives  Does Patient Have a Medical Advance Directive? Yes Yes Yes Yes  Type of AParamedicof AMc[place]ormickLiving will H[place]villeLiving will H[place] will   Does patient want to make changes to medical advance directive?   No - Patient declined   Copy of [place] Chart? Yes - validated most recent copy scanned in chart (See row  information) Yes - validated most recent copy scanned in chart (See row information) No - copy requested     Current Medications (verified) Outpatient Encounter Medications as of 03/29/2022  Medication Sig   amLODipine (NORVASC) 5 MG tablet TAKE 1 TABLET (5 MG TOTAL) BY MOUTH DAILY.   Ascorbic Acid (VITAMIN C PO) Take 375 mg by mouth daily.   aspirin EC 81 MG tablet Take 81 mg by mouth daily.   Calcium-Vitamin D-Vitamin K (VIACTIV PO) Take by mouth. Viactive chews-Take 1 pill bid   cycloSPORINE (RESTASIS) 0.05 % ophthalmic emulsion Place 1 drop into both eyes 2 (two) times daily.   DHA-EPA-Flaxseed Oil-Vitamin E (THERA TEARS NUTRITION PO) Take by mouth. Take 2 capsules daily   estradiol (ESTRACE) 1 MG tablet Take 1 mg by mouth daily.   Multiple Vitamins-Minerals (ONE-A-DAY 50 PLUS PO) Take by mouth daily.   Potassium (POTASSIMIN PO) Take 550 mg by mouth daily.   TURMERIC PO Take 1 tablet by mouth daily.   COVID-19 mRNA bivalent vaccine, Pfizer, (PFIZER COVID-19 VAC BIVALENT) injection Inject into the muscle. (Patient not taking: Reported on 03/29/2022)   No facility-administered encounter medications on file as of 03/29/2022.    Allergies (verified) Penicillins   History: Past Medical History:  Diagnosis Date   Actinic keratosis    Right superior central posterior tibial shave, Dr. SFontaine No  Anxiety    Pt does not like anything in her nose.   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   Depression  h/o   Dry eyes    Hx of endometriosis    Hx of migraines    Ovarian cyst    71 years old   Ulcer    17 y o   Past Surgical History:  Procedure Laterality Date   ABDOMINAL HYSTERECTOMY  1987   Total   APPENDECTOMY     CHOLECYSTECTOMY     LAPAROSCOPY  1990   For Endometriosis   RIGHT OOPHORECTOMY  1970   right tube removed   TONSILLECTOMY     Family History  Problem Relation Age of Onset   CAD Other        GM, uncle    Diabetes Other        mother side    Colon cancer Neg Hx    Breast cancer Neg Hx    Social History   Socioeconomic History   Marital status: Married    Spouse name: Not on file   Number of children: 0   Years of education: Not on file   Highest education level: Not on file  Occupational History   Occupation: retired  Tobacco Use   Smoking status: Never   Smokeless tobacco: Never  Substance and Sexual Activity   Alcohol use: Yes    Alcohol/week: 7.0 standard drinks of alcohol    Types: 7 Glasses of wine per week   Drug use: No   Sexual activity: Not on file  Other Topics Concern   Not on file  Social History Narrative   Married w/ Iona Beard    Social Determinants of Health   Financial Resource Strain: Low Risk  (03/29/2022)   Overall Financial Resource Strain (CARDIA)    Difficulty of Paying Living Expenses: Not hard at all  Food Insecurity: No Food Insecurity (03/29/2022)   Hunger Vital Sign    Worried About Running Out of Food in the Last Year: Never true    Ran Out of Food in the Last Year: Never true  Transportation Needs: No Transportation Needs (03/29/2022)   PRAPARE - Hydrologist (Medical): No    Lack of Transportation (Non-Medical): No  Physical Activity: Sufficiently Active (03/29/2022)   Exercise Vital Sign    Days of Exercise per Week: 6 days    Minutes of Exercise per Session: 30 min  Stress: No Stress Concern Present (03/29/2022)   [place]    Feeling of Stress : Not at all  Social Connections: Socially Isolated (03/29/2022)   Social Connection and Isolation Panel [NHANES]    Frequency of Communication with Friends and Family: Never    Frequency of Social Gatherings with Friends and Family: Once a week    Attends Religious Services: Never    Marine scientist or Organizations: No    Attends Music therapist: Never    Marital Status: Married     Tobacco Counseling Counseling given: Not Answered   Clinical Intake:  Pre-visit preparation completed: Yes  Pain : 0-10 Pain Score: 7  Pain Type: Acute pain (from moving a mattress) Pain Location: Back Pain Orientation: Lower Pain Onset: In the past 7 days Pain Frequency: Constant Pain Relieving Factors: heating pain & rest  Pain Relieving Factors: heating pain & rest  BMI - recorded: 26.61 Nutritional Status: BMI 25 -29 Overweight Nutritional Risks: None Diabetes: No  How often do you need to have someone help you when you read instructions, pamphlets, or other written materials from  your doctor or pharmacy?: 1 - Never  Diabetic?No  Interpreter Needed?: No  Information entered by :: Caroleen Hamman LPN   Activities of Daily Living    03/29/2022    2:10 PM  In your present state of health, do you have any difficulty performing the following activities:  Hearing? 0  Vision? 0  Difficulty concentrating or making decisions? 0  Walking or climbing stairs? 0  Dressing or bathing? 0  Doing errands, shopping? 0  Preparing Food and eating ? N  Using the Toilet? N  In the past six months, have you accidently leaked urine? N  Do you have problems with loss of bowel control? N  Managing your Medications? N  Managing your Finances? N  Housekeeping or managing your Housekeeping? N    Patient Care Team: Colon Branch, MD as PCP - Katherina Right, Manuela Schwartz, MD as Consulting Physician (Dermatology) Bobbye Charleston, MD as Consulting Physician (Obstetrics and Gynecology)  Indicate any recent Medical Services you may have received from other than [facility] providers in the past year (date may be approximate).     Assessment:   This is a routine wellness examination for Kelly Schneider.  Hearing/Vision screen Hearing Screening - Comments:: No issues Vision Screening - Comments:: Last eye exam-07/2021-Dr. Eulas Post  Dietary issues and exercise activities discussed: Current  Exercise Habits: Home exercise routine, Type of exercise: Other - see comments (exercise bike, exercise videos, yard work), Time (Minutes): 30, Frequency (Times/Week): 6, Weekly Exercise (Minutes/Week): 180, Intensity: Mild, Exercise limited by: None identified   Goals Addressed             This Visit's Progress    Maintain healthy active lifestyle.   On track      Depression Screen    03/29/2022    2:10 PM 06/17/2021    3:41 PM 03/19/2021    3:07 PM 11/06/2019   11:50 AM 08/21/2019    1:01 PM 07/23/2016    4:11 PM 03/18/2015    3:35 PM  PHQ 2/9 Scores  PHQ - 2 Score 0 0 0 0 0 0 0    Fall Risk    03/29/2022    2:08 PM 06/17/2021    3:41 PM 03/19/2021    3:07 PM 11/06/2019   11:50 AM 08/21/2019    1:01 PM  [place] in the past year? 0 0 0 0 1  Number falls in past yr: 0 0 0 0 0  Injury with Fall? 0 0 0 0 0  Follow up Falls prevention discussed Falls evaluation completed Falls prevention discussed Education provided;Falls prevention discussed Falls evaluation completed    FALL RISK PREVENTION PERTAINING TO THE HOME:  Any stairs in or around the home? No  Home free of loose throw rugs in walkways, pet beds, electrical cords, etc? Yes  Adequate lighting in your home to reduce risk of falls? Yes   ASSISTIVE DEVICES UTILIZED TO PREVENT FALLS:  Life alert? No  Use of a cane, walker or w/c? No  Grab bars in the bathroom? No  Shower chair or bench in shower? Yes  Elevated toilet seat or a handicapped toilet? No   TIMED UP AND GO:  Was the test performed? No . Phone visit   Cognitive Function:Normal cognitive status assessed by this Nurse Health Advisor. No abnormalities found.          Immunizations Immunization History  Administered Date(s) Administered   Fluad Quad(high Dose 65+) 06/17/2021   Influenza Split 07/01/2018, 05/29/2019  Influenza Whole 10/09/2010   Influenza, High Dose Seasonal PF 07/23/2016   Influenza-Unspecified 08/12/2014, 07/15/2015,  06/25/2020   PFIZER(Purple Top)SARS-COV-2 Vaccination 11/22/2019, 12/13/2019, 07/01/2020, 01/13/2021   Pfizer Covid-19 Vaccine Bivalent Booster 72yr & up 06/26/2021   Pneumococcal Conjugate-13 04/15/2015   Pneumococcal Polysaccharide-23 07/23/2016   Tdap 04/15/2015   Zoster Recombinat (Shingrix) 07/10/2021   Zoster, Live 05/05/2010    TDAP status: Up to date  Flu Vaccine status: Up to date  Pneumococcal vaccine status: Up to date  Covid-19 vaccine status: Completed vaccines  Qualifies for Shingles Vaccine? No   Zostavax completed Yes   Shingrix Completed?: Yes  Screening Tests Health Maintenance  Topic Date Due   Zoster Vaccines- Shingrix (2 of 2) 09/04/2021   INFLUENZA VACCINE  04/13/2022   MAMMOGRAM  12/17/2022   TETANUS/TDAP  04/14/2025   COLONOSCOPY (Pts 45-453yrInsurance coverage will need to be confirmed)  07/13/2026   Pneumonia Vaccine 6588Years old  Completed   DEXA SCAN  Completed   COVID-19 Vaccine  Completed   Hepatitis C Screening  Completed   HPV VACCINES  Aged Out    Health Maintenance  Health Maintenance Due  Topic Date Due   Zoster Vaccines- Shingrix (2 of 2) 09/04/2021    Colorectal cancer screening: Type of screening: Colonoscopy. Completed 07/13/2016. Repeat every 10 years  Mammogram status: Completed bilateral 12/16/2021. Repeat every year  Bone Density status: Completed 12/02/2020. Results reflect: Bone density results: NORMAL. Repeat every 2 years.  Lung Cancer Screening: (Low Dose CT Chest recommended if Age 73-80ears, 30 pack-year currently smoking OR have quit w/in 15years.) does not qualify.     Additional Screening:  Hepatitis C Screening: Completed 08/03/2016  Vision Screening: Recommended annual ophthalmology exams for early detection of glaucoma and other disorders of the eye. Is the patient up to date with their annual eye exam?  Yes  Who is the provider or what is the name of the office in which the patient attends annual  eye exams? Dr. GlEulas Post Dental Screening: Recommended annual dental exams for proper oral hygiene  Community Resource Referral / Chronic Care Management: CRR required this visit?  No   CCM required this visit?  No      Plan:     I have personally reviewed and noted the following in the patient's chart:   Medical and social history Use of alcohol, tobacco or illicit drugs  Current medications and supplements including opioid prescriptions.  Functional ability and status Nutritional status Physical activity Advanced directives List of other physicians Hospitalizations, surgeries, and ER visits in previous 12 months Vitals Screenings to include cognitive, depression, and falls Referrals and appointments  In addition, I have reviewed and discussed with patient certain preventive protocols, quality metrics, and best practice recommendations. A written personalized care plan for preventive services as well as general preventive health recommendations were provided to patient.   Due to this being a telephonic visit, the after visit summary with patients personalized plan was offered to patient via mail or my-chart. Patient would like to access on my-chart.  MaMarta AntuLPN   03/[id number] Health Advisor  Nurse Notes: None  I have reviewed and agree with Health Coaches documentation.  JoKathlene NovemberMD

## 2022-03-29 NOTE — Patient Instructions (Signed)
Kelly Schneider , Thank you for taking time to complete your Medicare Wellness Visit. I appreciate your ongoing commitment to your health goals. Please review the following plan we discussed and let me know if I can assist you in the future.   Screening recommendations/referrals: Colonoscopy: Completed 07/13/2016-Due-07/13/2026 Mammogram: Completed 12/16/2021-Due 12/17/2022 Bone Density: Completed 12/02/2020-Due 12/03/2022 Recommended yearly ophthalmology/optometry visit for glaucoma screening and checkup Recommended yearly dental visit for hygiene and checkup  Vaccinations: Influenza vaccine: Up to date Pneumococcal vaccine: Up to date Tdap vaccine: Up to date Shingles vaccine: Completed first dose. Please check with your pharmacy for your second dose.   Covid-19:Up to date  Advanced directives: Copy in chart  Conditions/risks identified: See problem list  Next appointment: Follow up in one year for your annual wellness visit    Preventive Care 65 Years and Older, Female Preventive care refers to lifestyle choices and visits with your health care provider that can promote health and wellness. What does preventive care include? A yearly physical exam. This is also called an annual well check. Dental exams once or twice a year. Routine eye exams. Ask your health care provider how often you should have your eyes checked. Personal lifestyle choices, including: Daily care of your teeth and gums. Regular physical activity. Eating a healthy diet. Avoiding tobacco and drug use. Limiting alcohol use. Practicing safe sex. Taking low-dose aspirin every day. Taking vitamin and mineral supplements as recommended by your health care provider. What happens during an annual well check? The services and screenings done by your health care provider during your annual well check will depend on your age, overall health, lifestyle risk factors, and family history of disease. Counseling  Your health care  provider may ask you questions about your: Alcohol use. Tobacco use. Drug use. Emotional well-being. Home and relationship well-being. Sexual activity. Eating habits. History of falls. Memory and ability to understand (cognition). Work and work Statistician. Reproductive health. Screening  You may have the following tests or measurements: Height, weight, and BMI. Blood pressure. Lipid and cholesterol levels. These may be checked every 5 years, or more frequently if you are over 70 years old. Skin check. Lung cancer screening. You may have this screening every year starting at age 49 if you have a 30-pack-year history of smoking and currently smoke or have quit within the past 15 years. Fecal occult blood test (FOBT) of the stool. You may have this test every year starting at age 39. Flexible sigmoidoscopy or colonoscopy. You may have a sigmoidoscopy every 5 years or a colonoscopy every 10 years starting at age 48. Hepatitis C blood test. Hepatitis B blood test. Sexually transmitted disease (STD) testing. Diabetes screening. This is done by checking your blood sugar (glucose) after you have not eaten for a while (fasting). You may have this done every 1-3 years. Bone density scan. This is done to screen for osteoporosis. You may have this done starting at age 54. Mammogram. This may be done every 1-2 years. Talk to your health care provider about how often you should have regular mammograms. Talk with your health care provider about your test results, treatment options, and if necessary, the need for more tests. Vaccines  Your health care provider may recommend certain vaccines, such as: Influenza vaccine. This is recommended every year. Tetanus, diphtheria, and acellular pertussis (Tdap, Td) vaccine. You may need a Td booster every 10 years. Zoster vaccine. You may need this after age 4. Pneumococcal 13-valent conjugate (PCV13) vaccine. One dose is  recommended after age  76. Pneumococcal polysaccharide (PPSV23) vaccine. One dose is recommended after age 10. Talk to your health care provider about which screenings and vaccines you need and how often you need them. This information is not intended to replace advice given to you by your health care provider. Make sure you discuss any questions you have with your health care provider. Document Released: 09/26/2015 Document Revised: 05/19/2016 Document Reviewed: 07/01/2015 Elsevier Interactive Patient Education  2017 [employer] [employer] Prevention in the Home Falls can cause injuries. They can happen to people of all ages. There are many things you can do to make your home safe and to help prevent falls. What can I do on the outside of my home? Regularly fix the edges of walkways and driveways and fix any cracks. Remove anything that might make you trip as you walk through a door, such as a raised step or threshold. Trim any bushes or trees on the path to your home. Use bright outdoor lighting. Clear any walking paths of anything that might make someone trip, such as rocks or tools. Regularly check to see if handrails are loose or broken. Make sure that both sides of any steps have handrails. Any raised decks and porches should have guardrails on the edges. Have any leaves, snow, or ice cleared regularly. Use sand or salt on walking paths during winter. Clean up any spills in your garage right away. This includes oil or grease spills. What can I do in the bathroom? Use night lights. Install grab bars by the toilet and in the tub and shower. Do not use towel bars as grab bars. Use non-skid mats or decals in the tub or shower. If you need to sit down in the shower, use a plastic, non-slip stool. Keep the floor dry. Clean up any water that spills on the floor as soon as it happens. Remove soap buildup in the tub or shower regularly. Attach bath mats securely with double-sided non-slip rug tape. Do not have throw  rugs and other things on the floor that can make you trip. What can I do in the bedroom? Use night lights. Make sure that you have a light by your bed that is easy to reach. Do not use any sheets or blankets that are too big for your bed. They should not hang down onto the floor. Have a firm chair that has side arms. You can use this for support while you get dressed. Do not have throw rugs and other things on the floor that can make you trip. What can I do in the kitchen? Clean up any spills right away. Avoid walking on wet floors. Keep items that you use a lot in easy-to-reach places. If you need to reach something above you, use a strong step stool that has a grab bar. Keep electrical cords out of the way. Do not use floor polish or wax that makes floors slippery. If you must use wax, use non-skid floor wax. Do not have throw rugs and other things on the floor that can make you trip. What can I do with my stairs? Do not leave any items on the stairs. Make sure that there are handrails on both sides of the stairs and use them. Fix handrails that are broken or loose. Make sure that handrails are as long as the stairways. Check any carpeting to make sure that it is firmly attached to the stairs. Fix any carpet that is loose or worn. Avoid having  throw rugs at the top or bottom of the stairs. If you do have throw rugs, attach them to the floor with carpet tape. Make sure that you have a light switch at the top of the stairs and the bottom of the stairs. If you do not have them, ask someone to add them for you. What else can I do to help prevent falls? Wear shoes that: Do not have high heels. Have rubber bottoms. Are comfortable and fit you well. Are closed at the toe. Do not wear sandals. If you use a stepladder: Make sure that it is fully opened. Do not climb a closed stepladder. Make sure that both sides of the stepladder are locked into place. Ask someone to hold it for you, if  possible. Clearly mark and make sure that you can see: Any grab bars or handrails. First and last steps. Where the edge of each step is. Use tools that help you move around (mobility aids) if they are needed. These include: Canes. Walkers. Scooters. Crutches. Turn on the lights when you go into a dark area. Replace any light bulbs as soon as they burn out. Set up your furniture so you have a clear path. Avoid moving your furniture around. If any of your floors are uneven, fix them. If there are any pets around you, be aware of where they are. Review your medicines with your doctor. Some medicines can make you feel dizzy. This can increase your chance of falling. Ask your doctor what other things that you can do to help prevent falls. This information is not intended to replace advice given to you by your health care provider. Make sure you discuss any questions you have with your health care provider. Document Released: 06/26/2009 Document Revised: 02/05/2016 Document Reviewed: 10/04/2014 Elsevier Interactive Patient Education  2017 [employer].

## 2022-03-30 ENCOUNTER — Ambulatory Visit: Payer: Medicare Other | Admitting: Internal Medicine

## 2022-04-28 ENCOUNTER — Encounter: Payer: Self-pay | Admitting: Internal Medicine

## 2022-04-28 ENCOUNTER — Ambulatory Visit (INDEPENDENT_AMBULATORY_CARE_PROVIDER_SITE_OTHER): Payer: Medicare Other | Admitting: Internal Medicine

## 2022-04-28 VITALS — BP 136/72 | HR 79 | Temp 98.2°F | Resp 16 | Ht 64.0 in | Wt 158.1 lb

## 2022-04-28 DIAGNOSIS — R399 Unspecified symptoms and signs involving the genitourinary system: Secondary | ICD-10-CM | POA: Diagnosis not present

## 2022-04-28 DIAGNOSIS — E785 Hyperlipidemia, unspecified: Secondary | ICD-10-CM | POA: Diagnosis not present

## 2022-04-28 DIAGNOSIS — I10 Essential (primary) hypertension: Secondary | ICD-10-CM

## 2022-04-28 NOTE — Patient Instructions (Addendum)
Recommend to proceed with the following vaccines at your pharmacy:  Covid booster (bivalent) Flu shot this fall  Continue checking your blood pressures BP GOAL is between 110/65 and  135/85. If it is consistently higher or lower, let me know    GO TO THE LAB : Get the blood work     [place], [place] back for a checkup in 6 months

## 2022-04-28 NOTE — Progress Notes (Unsigned)
Subjective:    Patient ID: Kelly Schneider, female    DOB: 01-Mar-1951, 71 y.o.   MRN: [id number]  DOS:  04/28/2022 Type of visit - description: Follow-up  Since the last office visit is doing well.  In June had a UTI, had antibiotics, still has occasional dysuria.  No gross hematuria.  No fever or chills.  No nausea vomiting or lower abdominal pain.  Would like her urine rechecked.  Ambulatory BPs are very good.   Review of Systems See above   Past Medical History:  Diagnosis Date   Actinic keratosis    Right superior central posterior tibial shave, Dr. Fontaine No   Anxiety    Pt does not like anything in her nose.   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   Depression    h/o   Dry eyes    Hx of endometriosis    Hx of migraines    Ovarian cyst    71 years old   Ulcer    74 y o    Past Surgical History:  Procedure Laterality Date   ABDOMINAL HYSTERECTOMY  1987   Total   APPENDECTOMY     CHOLECYSTECTOMY     LAPAROSCOPY  1990   For Endometriosis   RIGHT OOPHORECTOMY  1970   right tube removed   TONSILLECTOMY      Current Outpatient Medications  Medication Instructions   amLODipine (NORVASC) 5 MG tablet Oral, Daily   Ascorbic Acid (VITAMIN C PO) 375 mg, Oral, Daily   aspirin EC 81 mg, Oral, Daily   Calcium-Vitamin D-Vitamin K (VIACTIV PO) Oral, Viactive chews-Take 1 pill bid    Cranberry-Vitamin C-Probiotic (AZO CRANBERRY) 250-30 MG TABS 2 tablets, Oral, Daily   cycloSPORINE (RESTASIS) 0.05 % ophthalmic emulsion 1 drop, Both Eyes, 2 times daily   DHA-EPA-Flaxseed Oil-Vitamin E (THERA TEARS NUTRITION PO) Oral, Take 2 capsules daily   estradiol (ESTRACE) 1 mg, Oral, Daily   Magnesium 200 mg, Oral, 2 times daily   Multiple Vitamins-Minerals (ONE-A-DAY 50 PLUS PO) Oral, Daily   Potassium (POTASSIMIN PO) 550 mg, Oral, Daily   TURMERIC PO 1 tablet, Oral, Daily       Objective:   Physical Exam BP 136/72   Pulse 79    Temp 98.2 F (36.8 C) (Oral)   Resp 16   Ht '5\' 4"'$  (1.626 m)   Wt 158 lb 2 oz (71.7 kg)   SpO2 98%   BMI 27.14 kg/m  General:   Well developed, NAD, BMI noted.  HEENT:  Normocephalic . Face symmetric, atraumatic Lungs:  CTA B Normal respiratory effort, no intercostal retractions, no accessory muscle use. Heart: RRR,  no murmur.  Abdomen:  Not distended, soft, non-tender. No rebound or rigidity.  No CVA tenderness Skin: Not pale. Not jaundice Lower extremities: no pretibial edema bilaterally  Neurologic:  alert & oriented X3.  Speech normal, gait appropriate for age and unassisted Psych--  Cognition and judgment appear intact.  Cooperative with normal attention span and concentration.  Behavior appropriate. No anxious or depressed appearing.     Assessment    ASSESSMENT HTN H/o Anxiety depression Osteopenia: DEXA normal 2016 and 11/2020  Actinic keratosis History of migraines Dry eyes  DOE: Low risk Myoview 12/30-20 20  PLAN HTN: Ambulatory BPs range from 117/59 to 125/62.  Continue amlodipine, check CMP and FLP LUTS: Had a UTI in June, treated with Macrobid, still has lingering symptoms (dysuria).  Taking OTC Azo cranberry.  Plan: Take OTCs as needed only, check UA urine culture, further advised with results. Preventive care: s/p  Shingrix x2, saw gynecology, had a mammogram. Recommend COVID-vaccine booster and a flu shot this fall RTC 6 months

## 2022-04-29 LAB — URINALYSIS, ROUTINE W REFLEX MICROSCOPIC
Bilirubin Urine: NEGATIVE
Hgb urine dipstick: NEGATIVE
Ketones, ur: NEGATIVE
Leukocytes,Ua: NEGATIVE
Nitrite: NEGATIVE
RBC / HPF: NONE SEEN (ref 0–?)
Specific Gravity, Urine: 1.01 (ref 1.000–1.030)
Total Protein, Urine: NEGATIVE
Urine Glucose: NEGATIVE
Urobilinogen, UA: 0.2 (ref 0.0–1.0)
pH: 7 (ref 5.0–8.0)

## 2022-04-29 LAB — COMPREHENSIVE METABOLIC PANEL
ALT: 14 U/L (ref 0–35)
AST: 16 U/L (ref 0–37)
Albumin: 4.3 g/dL (ref 3.5–5.2)
Alkaline Phosphatase: 79 U/L (ref 39–117)
BUN: 14 mg/dL (ref 6–23)
CO2: 29 mEq/L (ref 19–32)
Calcium: 9.6 mg/dL (ref 8.4–10.5)
Chloride: 101 mEq/L (ref 96–112)
Creatinine, Ser: 0.82 mg/dL (ref 0.40–1.20)
GFR: 70.88 mL/min (ref >60.00)
Glucose, Bld: 95 mg/dL (ref 70–99)
Potassium: 4 mEq/L (ref 3.5–5.1)
Sodium: 136 mEq/L (ref 135–145)
Total Bilirubin: 0.3 mg/dL (ref 0.2–1.2)
Total Protein: 7.1 g/dL (ref 6.0–8.3)

## 2022-04-29 LAB — LIPID PANEL
Cholesterol: 211 mg/dL — ABNORMAL HIGH (ref 0–200)
HDL: 99.7 mg/dL (ref >39.00)
LDL Cholesterol: 85 mg/dL (ref 0–99)
NonHDL: 111.34
Total CHOL/HDL Ratio: 2
Triglycerides: 134 mg/dL (ref 0.0–149.0)
VLDL: 26.8 mg/dL (ref 0.0–40.0)

## 2022-04-29 NOTE — Assessment & Plan Note (Signed)
HTN: Ambulatory BPs range from 117/59 to 125/62.  Continue amlodipine, check CMP and FLP LUTS: Had a UTI in June, treated with Macrobid, still has lingering symptoms (dysuria).  Taking OTC Azo cranberry. Plan: Take OTCs as needed only, check UA urine culture, further advised with results. Preventive care: s/p  Shingrix x2, saw gynecology, had a mammogram. Recommend COVID-vaccine booster and a flu shot this fall RTC 6 months

## 2022-04-30 LAB — URINE CULTURE
MICRO NUMBER:: 13787758
Result:: NO GROWTH
SPECIMEN QUALITY:: ADEQUATE

## 2022-07-01 ENCOUNTER — Other Ambulatory Visit: Payer: Self-pay | Admitting: Internal Medicine

## 2022-07-12 ENCOUNTER — Other Ambulatory Visit (HOSPITAL_BASED_OUTPATIENT_CLINIC_OR_DEPARTMENT_OTHER): Payer: Self-pay

## 2022-07-12 DIAGNOSIS — Z23 Encounter for immunization: Secondary | ICD-10-CM | POA: Diagnosis not present

## 2022-07-12 MED ORDER — COMIRNATY 30 MCG/0.3ML IM SUSY
PREFILLED_SYRINGE | INTRAMUSCULAR | 0 refills | Status: DC
Start: 2022-07-12 — End: 2023-01-14
  Filled 2022-07-12: qty 0.3, 1d supply, fill #0

## 2022-07-28 ENCOUNTER — Other Ambulatory Visit (HOSPITAL_BASED_OUTPATIENT_CLINIC_OR_DEPARTMENT_OTHER): Payer: Self-pay

## 2022-07-28 DIAGNOSIS — Z23 Encounter for immunization: Secondary | ICD-10-CM | POA: Diagnosis not present

## 2022-07-28 MED ORDER — FLUAD QUADRIVALENT 0.5 ML IM PRSY
PREFILLED_SYRINGE | INTRAMUSCULAR | 0 refills | Status: DC
  Filled 2022-07-28: qty 0.5, 1d supply, fill #0

## 2022-08-12 DIAGNOSIS — H04123 Dry eye syndrome of bilateral lacrimal glands: Secondary | ICD-10-CM | POA: Diagnosis not present

## 2022-08-12 DIAGNOSIS — H04212 Epiphora due to excess lacrimation, left lacrimal gland: Secondary | ICD-10-CM | POA: Diagnosis not present

## 2022-08-12 DIAGNOSIS — H5213 Myopia, bilateral: Secondary | ICD-10-CM | POA: Diagnosis not present

## 2022-08-12 DIAGNOSIS — H02825 Cysts of left lower eyelid: Secondary | ICD-10-CM | POA: Diagnosis not present

## 2022-08-12 DIAGNOSIS — H2513 Age-related nuclear cataract, bilateral: Secondary | ICD-10-CM | POA: Diagnosis not present

## 2022-08-23 ENCOUNTER — Other Ambulatory Visit (HOSPITAL_BASED_OUTPATIENT_CLINIC_OR_DEPARTMENT_OTHER): Payer: Self-pay

## 2022-08-23 MED ORDER — AREXVY 120 MCG/0.5ML IM SUSR
INTRAMUSCULAR | 0 refills | Status: DC
  Filled 2022-08-23: qty 0.5, 1d supply, fill #0

## 2022-10-29 ENCOUNTER — Ambulatory Visit: Payer: Medicare Other | Admitting: Internal Medicine

## 2022-11-23 ENCOUNTER — Other Ambulatory Visit (HOSPITAL_BASED_OUTPATIENT_CLINIC_OR_DEPARTMENT_OTHER): Payer: Self-pay | Admitting: Internal Medicine

## 2022-11-23 DIAGNOSIS — Z1231 Encounter for screening mammogram for malignant neoplasm of breast: Secondary | ICD-10-CM

## 2022-12-14 ENCOUNTER — Ambulatory Visit: Payer: Medicare Other | Admitting: Internal Medicine

## 2023-01-03 ENCOUNTER — Ambulatory Visit (HOSPITAL_BASED_OUTPATIENT_CLINIC_OR_DEPARTMENT_OTHER)
Admission: RE | Admit: 2023-01-03 | Discharge: 2023-01-03 | Disposition: A | Discharge status: Home / Self Care | Payer: Medicare Other | Source: Ambulatory Visit | Attending: Internal Medicine | Admitting: Internal Medicine

## 2023-01-03 ENCOUNTER — Encounter (HOSPITAL_BASED_OUTPATIENT_CLINIC_OR_DEPARTMENT_OTHER): Payer: Self-pay

## 2023-01-03 DIAGNOSIS — Z1231 Encounter for screening mammogram for malignant neoplasm of breast: Secondary | ICD-10-CM | POA: Insufficient documentation

## 2023-01-14 ENCOUNTER — Ambulatory Visit (INDEPENDENT_AMBULATORY_CARE_PROVIDER_SITE_OTHER): Payer: Medicare Other | Admitting: Internal Medicine

## 2023-01-14 ENCOUNTER — Encounter: Payer: Self-pay | Admitting: Internal Medicine

## 2023-01-14 VITALS — BP 136/70 | HR 81 | Temp 98.1°F | Resp 16 | Ht 64.0 in | Wt 159.0 lb

## 2023-01-14 DIAGNOSIS — I10 Essential (primary) hypertension: Secondary | ICD-10-CM | POA: Diagnosis not present

## 2023-01-14 DIAGNOSIS — E785 Hyperlipidemia, unspecified: Secondary | ICD-10-CM | POA: Diagnosis not present

## 2023-01-14 NOTE — Progress Notes (Unsigned)
Subjective:    Patient ID: Kelly Schneider, female    DOB: 1950/11/09, 72 y.o.   MRN: [id number]  DOS:  01/14/2023 Type of visit - description: f/u  Since the last office visit is doing well Lifestyle is excellent.  Review of Systems See above   Past Medical History:  Diagnosis Date   Actinic keratosis    Right superior central posterior tibial shave, Dr. Leonie Man   Anxiety    Pt does not like anything in her nose.   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   Depression    h/o   Dry eyes    Hx of endometriosis    Hx of migraines    Ovarian cyst    72 years old   Ulcer    65 y o    Past Surgical History:  Procedure Laterality Date   ABDOMINAL HYSTERECTOMY  1987   Total   APPENDECTOMY     CHOLECYSTECTOMY     LAPAROSCOPY  1990   For Endometriosis   RIGHT OOPHORECTOMY  1970   right tube removed   TONSILLECTOMY      Current Outpatient Medications  Medication Instructions   amLODipine (NORVASC) 5 mg, Oral, Daily   Ascorbic Acid (VITAMIN C PO) 375 mg, Oral, Daily   aspirin EC 81 mg, Oral, Daily   Calcium-Vitamin D-Vitamin K (VIACTIV PO) Oral, Viactive chews-Take 1 pill bid    Cranberry-Vitamin C-Probiotic (AZO CRANBERRY) 250-30 MG TABS 2 tablets, Oral, Daily   cycloSPORINE (RESTASIS) 0.05 % ophthalmic emulsion 1 drop, Both Eyes, 2 times daily   DHA-EPA-Flaxseed Oil-Vitamin E (THERA TEARS NUTRITION PO) Oral, Take 2 capsules daily   estradiol (ESTRACE) 1 mg, Oral, Daily   Magnesium 200 mg, Oral, 2 times daily   Multiple Vitamins-Minerals (ONE-A-DAY 50 PLUS PO) Oral, Daily   Plant Stanol Ester (BENECOL PO) Oral, 6 chews daily<BR>   Potassium (POTASSIMIN PO) 550 mg, Oral, Daily   TURMERIC PO 1 tablet, Oral, Daily       Objective:   Physical Exam BP 136/70   Pulse 81   Temp 98.1 F (36.7 C) (Oral)   Resp 16   Ht 5\' 4"  (1.626 m)   Wt 159 lb (72.1 kg)   SpO2 97%   BMI 27.29 kg/m  General: Well developed, NAD,  BMI noted Neck: No  thyromegaly  HEENT:  Normocephalic . Face symmetric, atraumatic Lungs:  CTA B Normal respiratory effort, no intercostal retractions, no accessory muscle use. Heart: RRR,  no murmur.  Abdomen:  Not distended, soft, non-tender. No rebound or rigidity.   Lower extremities: no pretibial edema bilaterally  Skin: Exposed areas without rash. Not pale. Not jaundice Neurologic:  alert & oriented X3.  Speech normal, gait appropriate for age and unassisted Strength symmetric and appropriate for age.  Psych: Cognition and judgment appear intact.  Cooperative with normal attention span and concentration.  Behavior appropriate. No anxious or depressed appearing.     Assessment     ASSESSMENT HTN H/o Anxiety depression Osteopenia: DEXA normal 2016 and 11/2020  Actinic keratosis History of migraines Dry eyes  DOE: Low risk Myoview 12/30-20 20  PLAN HTN: Ambulatory BPs consistently in the 120s, on amlodipine, check labs. Dyslipidemia: Diet controlled, CV RF increased mostly due to age.  Statins were discussed.  She would prefer not to take any more medications, eventually we agreed that as long as she is keeping her LDL very good (currently 88) we won't start  therapy. Aspirin: Discussed pros and cons, again cardiovascular risk elevated, will stay on aspirin for now. HRT: On estrogen as recommended by gynecology. Preventive care reviewed.  See separate documentation.  RTC 6 months.   Td 2016;  PNM 13:  2016; PNM  23 -07-2016 Zoster 2011,s/p shingrix, s/p RSV - covid vax : 06-2022 Vaccines I recommend: PNM 20, flu shot every fall.  CCS: last cscope 06-2016 (-), Dr Leone Payor , 72 years Female care: Had a Pap smear 2020, MMG/2024.   [place] (-) AAA 04-2015 Healthcare POA: On file  The 10-year ASCVD risk score (Arnett DK, et al., 2019) is: 21.1%   Values used to calculate the score:     Age: 70 years     Sex: Female     Is Non-Hispanic African American: No      Diabetic: No     Tobacco smoker: No     Systolic Blood Pressure: 136 mmHg     Is BP treated: Yes     HDL Cholesterol: 99.7 mg/dL     Total Cholesterol: 211 mg/dL    8=== HTN: Ambulatory BPs range from 117/59 to 125/62.  Continue amlodipine, check CMP and FLP LUTS: Had a UTI in June, treated with Macrobid, still has lingering symptoms (dysuria).  Taking OTC Azo cranberry. Plan: Take OTCs as needed only, check UA urine culture, further advised with results. Preventive care: s/p  Shingrix x2, saw gynecology, had a mammogram. Recommend COVID-vaccine booster and a flu shot this fall RTC 6 months

## 2023-01-14 NOTE — Patient Instructions (Addendum)
  Consider pneumonia shot (PNM 20) at the pharmacy  Flu shot every fall  Continue checking your blood pressures BP GOAL is between 110/65 and  135/85. If it is consistently higher or lower, let me know       GO TO THE LAB : Get the blood work     GO TO THE FRONT DESK, PLEASE SCHEDULE YOUR APPOINTMENTS Come back for   a checkup in 6 months

## 2023-01-15 LAB — COMPREHENSIVE METABOLIC PANEL
AG Ratio: 1.8 (calc) (ref 1.0–2.5)
ALT: 14 U/L (ref 6–29)
AST: 16 U/L (ref 10–35)
Albumin: 4.4 g/dL (ref 3.6–5.1)
Alkaline phosphatase (APISO): 74 U/L (ref 37–153)
BUN: 11 mg/dL (ref 7–25)
CO2: 26 mmol/L (ref 20–32)
Calcium: 9.4 mg/dL (ref 8.6–10.4)
Chloride: 101 mmol/L (ref 98–110)
Creat: 0.86 mg/dL (ref 0.60–1.00)
Globulin: 2.4 g/dL (calc) (ref 1.9–3.7)
Glucose, Bld: 97 mg/dL (ref 65–99)
Potassium: 4.2 mmol/L (ref 3.5–5.3)
Sodium: 138 mmol/L (ref 135–146)
Total Bilirubin: 0.5 mg/dL (ref 0.2–1.2)
Total Protein: 6.8 g/dL (ref 6.1–8.1)

## 2023-01-15 LAB — CBC WITH DIFFERENTIAL/PLATELET
Absolute Monocytes: 800 cells/uL (ref 200–950)
Basophils Absolute: 80 cells/uL (ref 0–200)
Basophils Relative: 1 %
Eosinophils Absolute: 152 cells/uL (ref 15–500)
Eosinophils Relative: 1.9 %
HCT: 39.9 % (ref 35.0–45.0)
Hemoglobin: 13.6 g/dL (ref 11.7–15.5)
Lymphs Abs: 2384 cells/uL (ref 850–3900)
MCH: 29.7 pg (ref 27.0–33.0)
MCHC: 34.1 g/dL (ref 32.0–36.0)
MCV: 87.1 fL (ref 80.0–100.0)
MPV: 10.8 fL (ref 7.5–12.5)
Monocytes Relative: 10 %
Neutro Abs: 4584 cells/uL (ref 1500–7800)
Neutrophils Relative %: 57.3 %
Platelets: 285 10*3/uL (ref 140–400)
RBC: 4.58 10*6/uL (ref 3.80–5.10)
RDW: 12.5 % (ref 11.0–15.0)
Total Lymphocyte: 29.8 %
WBC: 8 10*3/uL (ref 3.8–10.8)

## 2023-01-15 LAB — LIPID PANEL
Cholesterol: 200 mg/dL — ABNORMAL HIGH (ref <200)
HDL: 112 mg/dL (ref >=50)
LDL Cholesterol (Calc): 70 mg/dL (calc)
Non-HDL Cholesterol (Calc): 88 mg/dL (calc) (ref <130)
Total CHOL/HDL Ratio: 1.8 (calc) (ref <5.0)
Triglycerides: 96 mg/dL (ref <150)

## 2023-01-15 NOTE — Assessment & Plan Note (Signed)
Td 2016;  PNM 13:  2016; PNM  23 -07-2016 Zoster 2011,s/p shingrix, s/p RSV - covid vax : 06-2022 Vaccines I recommend: PNM 20, flu shot every fall. CCS: last cscope 06-2016 (-), Dr Leone Payor , 72 years Female care: Had a Pap smear 2020, MMG/2024.   [place] (-) AAA 04-2015 Healthcare POA: On file

## 2023-01-15 NOTE — Assessment & Plan Note (Signed)
HTN: Ambulatory BPs consistently in the 120s, on amlodipine, check labs. Dyslipidemia: Diet controlled, CV RF increased mostly due to age.  Statins were discussed.  She would prefer not to take any more medications, eventually we agreed that as long as she is keeping her LDL very good (currently 88) we won't start statins Aspirin: Discussed pros and cons, again cardiovascular risk elevated, will stay on aspirin for now. HRT: On estrogen as recommended by gynecology. Preventive care reviewed.  See separate documentation. RTC 6 months.

## 2023-01-17 ENCOUNTER — Telehealth: Payer: Self-pay | Admitting: Internal Medicine

## 2023-01-17 DIAGNOSIS — E785 Hyperlipidemia, unspecified: Secondary | ICD-10-CM

## 2023-01-17 MED ORDER — ATORVASTATIN CALCIUM 10 MG PO TABS: 10.0000 mg | ORAL_TABLET | Freq: Every day | ORAL | 0 refills | Status: DC

## 2023-01-17 NOTE — Telephone Encounter (Signed)
Please advise 

## 2023-01-17 NOTE — Telephone Encounter (Signed)
In the past we talk about the benefits of statins, see last visit. Plan: Atorvastatin 10 mg 1 p.o. nightly, 57-month supply FLP AST ALT in 6 weeks.

## 2023-01-17 NOTE — Telephone Encounter (Signed)
Patient states she would like to start the cholesterol medication offered to her on her last OV. Both her and her husband want to start it. Please advise.

## 2023-02-02 ENCOUNTER — Other Ambulatory Visit: Payer: Self-pay | Admitting: Internal Medicine

## 2023-02-03 ENCOUNTER — Telehealth: Payer: Self-pay | Admitting: Internal Medicine

## 2023-02-03 MED ORDER — AMLODIPINE BESYLATE 5 MG PO TABS: 5.0000 mg | ORAL_TABLET | Freq: Every day | ORAL | 1 refills | Status: DC

## 2023-02-03 NOTE — Telephone Encounter (Signed)
Prescription Request  02/03/2023  Is this a "Controlled Substance" medicine? No  LOV: 01/14/2023  What is the name of the medication or equipment?   amLODipine (NORVASC) 5 MG tablet [[id number]]   Have you contacted your pharmacy to request a refill? No   Which pharmacy would you like this sent to?  [id number] [place], [place] - [address] [place] [address] Phone: [phone] Fax: [phone]    Patient notified that their request is being sent to the clinical staff for review and that they should receive a response within 2 business days.   Please advise at Mobile There is no such number on file (mobile).

## 2023-02-03 NOTE — Addendum Note (Signed)
Addended byConrad Wentworth D on: 02/03/2023 10:36 AM   Modules accepted: Orders

## 2023-02-03 NOTE — Telephone Encounter (Signed)
Rx sent 

## 2023-03-04 ENCOUNTER — Other Ambulatory Visit (INDEPENDENT_AMBULATORY_CARE_PROVIDER_SITE_OTHER): Payer: Medicare Other

## 2023-03-04 DIAGNOSIS — E785 Hyperlipidemia, unspecified: Secondary | ICD-10-CM

## 2023-03-04 NOTE — Addendum Note (Signed)
Addended by: Thelma Barge D on: 03/04/2023 03:14 PM   Modules accepted: Orders

## 2023-03-04 NOTE — Addendum Note (Signed)
Addended by: Lanetta Figuero D on: 03/04/2023 03:14 PM   Modules accepted: Orders  

## 2023-03-04 NOTE — Addendum Note (Signed)
Addended by: Aliveah Gallant D on: 03/04/2023 03:14 PM   Modules accepted: Orders  

## 2023-03-05 LAB — LIPID PANEL
Cholesterol: 162 mg/dL (ref <200)
HDL: 110 mg/dL (ref >=50)
LDL Cholesterol (Calc): 36 mg/dL (calc)
Non-HDL Cholesterol (Calc): 52 mg/dL (calc) (ref <130)
Total CHOL/HDL Ratio: 1.5 (calc) (ref <5.0)
Triglycerides: 76 mg/dL (ref <150)

## 2023-03-05 LAB — AST: AST: 15 U/L (ref 10–35)

## 2023-03-05 LAB — ALT: ALT: 18 U/L (ref 6–29)

## 2023-03-07 MED ORDER — ATORVASTATIN CALCIUM 10 MG PO TABS: 10.0000 mg | ORAL_TABLET | Freq: Every day | ORAL | 3 refills | Status: DC

## 2023-03-07 NOTE — Addendum Note (Signed)
Addended byConrad [place] D on: 03/07/2023 07:50 AM   Modules accepted: Orders

## 2023-04-07 ENCOUNTER — Ambulatory Visit (INDEPENDENT_AMBULATORY_CARE_PROVIDER_SITE_OTHER): Payer: Medicare Other | Admitting: Emergency Medicine

## 2023-04-07 VITALS — Ht 63.0 in | Wt 153.0 lb

## 2023-04-07 DIAGNOSIS — Z0000 Encounter for general adult medical examination without abnormal findings: Secondary | ICD-10-CM | POA: Diagnosis not present

## 2023-04-07 NOTE — Patient Instructions (Signed)
Kelly Schneider , Thank you for taking time to come for your Medicare Wellness Visit. I appreciate your ongoing commitment to your health goals. Please review the following plan we discussed and let me know if I can assist you in the future.   Referrals/Orders/Follow-Ups/Clinician Recommendations: Keep up the great work!  This is a list of the screening recommended for you and due dates:  Health Maintenance  Topic Date Due   COVID-19 Vaccine (7 - 2023-24 season) 04/28/2023*   Flu Shot  04/14/2023   Mammogram  01/03/2024   Medicare Annual Wellness Visit  04/06/2024   DTaP/Tdap/Td vaccine (2 - Td or Tdap) 04/14/2025   DEXA scan (bone density measurement)  12/02/2025   Colon Cancer Screening  07/13/2026   Pneumonia Vaccine  Completed   Hepatitis C Screening  Completed   Zoster (Shingles) Vaccine  Completed   HPV Vaccine  Aged Out  *Topic was postponed. The date shown is not the original due date.    Advanced directives: on file  Next Medicare Annual Wellness Visit scheduled for next year: Yes  Preventive Care 20 Years and Older, Female Preventive care refers to lifestyle choices and visits with your health care provider that can promote health and wellness. What does preventive care include? A yearly physical exam. This is also called an annual well check. Dental exams once or twice a year. Routine eye exams. Ask your health care provider how often you should have your eyes checked. Personal lifestyle choices, including: Daily care of your teeth and gums. Regular physical activity. Eating a healthy diet. Avoiding tobacco and drug use. Limiting alcohol use. Practicing safe sex. Taking low-dose aspirin every day. Taking vitamin and mineral supplements as recommended by your health care provider. What happens during an annual well check? The services and screenings done by your health care provider during your annual well check will depend on your age, overall health, lifestyle risk  factors, and family history of disease. Counseling  Your health care provider may ask you questions about your: Alcohol use. Tobacco use. Drug use. Emotional well-being. Home and relationship well-being. Sexual activity. Eating habits. History of falls. Memory and ability to understand (cognition). Work and work Astronomer. Reproductive health. Screening  You may have the following tests or measurements: Height, weight, and BMI. Blood pressure. Lipid and cholesterol levels. These may be checked every 5 years, or more frequently if you are over 55 years old. Skin check. Lung cancer screening. You may have this screening every year starting at age 60 if you have a 30-pack-year history of smoking and currently smoke or have quit within the past 15 years. Fecal occult blood test (FOBT) of the stool. You may have this test every year starting at age 30. Flexible sigmoidoscopy or colonoscopy. You may have a sigmoidoscopy every 5 years or a colonoscopy every 10 years starting at age 69. Hepatitis C blood test. Hepatitis B blood test. Sexually transmitted disease (STD) testing. Diabetes screening. This is done by checking your blood sugar (glucose) after you have not eaten for a while (fasting). You may have this done every 1-3 years. Bone density scan. This is done to screen for osteoporosis. You may have this done starting at age 19. Mammogram. This may be done every 1-2 years. Talk to your health care provider about how often you should have regular mammograms. Talk with your health care provider about your test results, treatment options, and if necessary, the need for more tests. Vaccines  Your health care provider  may recommend certain vaccines, such as: Influenza vaccine. This is recommended every year. Tetanus, diphtheria, and acellular pertussis (Tdap, Td) vaccine. You may need a Td booster every 10 years. Zoster vaccine. You may need this after age 83. Pneumococcal 13-valent  conjugate (PCV13) vaccine. One dose is recommended after age 1. Pneumococcal polysaccharide (PPSV23) vaccine. One dose is recommended after age 11. Talk to your health care provider about which screenings and vaccines you need and how often you need them. This information is not intended to replace advice given to you by your health care provider. Make sure you discuss any questions you have with your health care provider. Document Released: 09/26/2015 Document Revised: 05/19/2016 Document Reviewed: 07/01/2015 Elsevier Interactive Patient Education  2017 [employer].  Fall Prevention in the Home Falls can cause injuries. They can happen to people of all ages. There are many things you can do to make your home safe and to help prevent falls. What can I do on the outside of my home? Regularly fix the edges of walkways and driveways and fix any cracks. Remove anything that might make you trip as you walk through a door, such as a raised step or threshold. Trim any bushes or trees on the path to your home. Use bright outdoor lighting. Clear any walking paths of anything that might make someone trip, such as rocks or tools. Regularly check to see if handrails are loose or broken. Make sure that both sides of any steps have handrails. Any raised decks and porches should have guardrails on the edges. Have any leaves, snow, or ice cleared regularly. Use sand or salt on walking paths during winter. Clean up any spills in your garage right away. This includes oil or grease spills. What can I do in the bathroom? Use night lights. Install grab bars by the toilet and in the tub and shower. Do not use towel bars as grab bars. Use non-skid mats or decals in the tub or shower. If you need to sit down in the shower, use a plastic, non-slip stool. Keep the floor dry. Clean up any water that spills on the floor as soon as it happens. Remove soap buildup in the tub or shower regularly. Attach bath mats  securely with double-sided non-slip rug tape. Do not have throw rugs and other things on the floor that can make you trip. What can I do in the bedroom? Use night lights. Make sure that you have a light by your bed that is easy to reach. Do not use any sheets or blankets that are too big for your bed. They should not hang down onto the floor. Have a firm chair that has side arms. You can use this for support while you get dressed. Do not have throw rugs and other things on the floor that can make you trip. What can I do in the kitchen? Clean up any spills right away. Avoid walking on wet floors. Keep items that you use a lot in easy-to-reach places. If you need to reach something above you, use a strong step stool that has a grab bar. Keep electrical cords out of the way. Do not use floor polish or wax that makes floors slippery. If you must use wax, use non-skid floor wax. Do not have throw rugs and other things on the floor that can make you trip. What can I do with my stairs? Do not leave any items on the stairs. Make sure that there are handrails on both sides  of the stairs and use them. Fix handrails that are broken or loose. Make sure that handrails are as long as the stairways. Check any carpeting to make sure that it is firmly attached to the stairs. Fix any carpet that is loose or worn. Avoid having throw rugs at the top or bottom of the stairs. If you do have throw rugs, attach them to the floor with carpet tape. Make sure that you have a light switch at the top of the stairs and the bottom of the stairs. If you do not have them, ask someone to add them for you. What else can I do to help prevent falls? Wear shoes that: Do not have high heels. Have rubber bottoms. Are comfortable and fit you well. Are closed at the toe. Do not wear sandals. If you use a stepladder: Make sure that it is fully opened. Do not climb a closed stepladder. Make sure that both sides of the stepladder  are locked into place. Ask someone to hold it for you, if possible. Clearly mark and make sure that you can see: Any grab bars or handrails. First and last steps. Where the edge of each step is. Use tools that help you move around (mobility aids) if they are needed. These include: Canes. Walkers. Scooters. Crutches. Turn on the lights when you go into a dark area. Replace any light bulbs as soon as they burn out. Set up your furniture so you have a clear path. Avoid moving your furniture around. If any of your floors are uneven, fix them. If there are any pets around you, be aware of where they are. Review your medicines with your doctor. Some medicines can make you feel dizzy. This can increase your chance of falling. Ask your doctor what other things that you can do to help prevent falls. This information is not intended to replace advice given to you by your health care provider. Make sure you discuss any questions you have with your health care provider. Document Released: 06/26/2009 Document Revised: 02/05/2016 Document Reviewed: 10/04/2014 Elsevier Interactive Patient Education  2017 [employer].

## 2023-04-07 NOTE — Progress Notes (Signed)
Subjective:  Vital Signs: Unable to obtain new vitals due to this being a telehealth visit.    Kelly Schneider is a 72 y.o. female who presents for Medicare Annual (Subsequent) preventive examination.  Visit Complete: Virtual  I connected with  Epifanio Lesches on 04/07/23 by a audio enabled telemedicine application and verified that I am speaking with the correct person using two identifiers.  Patient Location: Home  Provider Location: Home Office  I discussed the limitations of evaluation and management by telemedicine. The patient expressed understanding and agreed to proceed.   Review of Systems     Cardiac Risk Factors include: advanced age (>27men, >87 women);hypertension;dyslipidemia     Objective:    Today's Vitals   04/07/23 1458  Weight: 153 lb (69.4 kg)  Height: 5\' 3"  (1.6 m)   Body mass index is 27.1 kg/m.     04/07/2023    3:25 PM 03/29/2022    2:06 PM 03/19/2021    3:05 PM 11/06/2019   11:47 AM 07/13/2016   10:10 AM  Advanced Directives  Does Patient Have a Medical Advance Directive? Yes Yes Yes Yes Yes  Type of Estate agent of [place];Living will Healthcare Power of [place];Living will Healthcare Power of [place];Living will Healthcare Power of [place];Living will   Does patient want to make changes to medical advance directive? No - Patient declined   No - Patient declined   Copy of Healthcare Power of Attorney in Chart? Yes - validated most recent copy scanned in chart (See row information) Yes - validated most recent copy scanned in chart (See row information) Yes - validated most recent copy scanned in chart (See row information) No - copy requested     Current Medications (verified) Outpatient Encounter Medications as of 04/07/2023  Medication Sig   amLODipine (NORVASC) 5 MG tablet Take 1 tablet (5 mg total) by mouth daily.   Ascorbic Acid (VITAMIN C PO) Take 375 mg by mouth daily.   aspirin EC 81 MG tablet Take 81 mg  by mouth daily.   atorvastatin (LIPITOR) 10 MG tablet Take 1 tablet (10 mg total) by mouth at bedtime.   Calcium-Vitamin D-Vitamin K (VIACTIV PO) Take by mouth. Viactive chews-Take 1 pill bid   cycloSPORINE (RESTASIS) 0.05 % ophthalmic emulsion Place 1 drop into both eyes 2 (two) times daily.   DHA-EPA-Flaxseed Oil-Vitamin E (THERA TEARS NUTRITION PO) Take by mouth. Take 2 capsules daily   estradiol (ESTRACE) 1 MG tablet Take 1 mg by mouth daily.   Magnesium 200 MG TABS Take 200 mg by mouth in the morning and at bedtime.   Multiple Vitamins-Minerals (ONE-A-DAY 50 PLUS PO) Take by mouth daily.   Plant Stanol Ester (BENECOL PO) Take by mouth. 6 chews daily   Potassium (POTASSIMIN PO) Take 550 mg by mouth daily.   TURMERIC PO Take 1 tablet by mouth daily.   Cranberry-Vitamin C-Probiotic (AZO CRANBERRY) 250-30 MG TABS Take 2 tablets by mouth daily. (Patient not taking: Reported on 04/07/2023)   No facility-administered encounter medications on file as of 04/07/2023.    Allergies (verified) Penicillins   History: Past Medical History:  Diagnosis Date   Actinic keratosis    Right superior central posterior tibial shave, Dr. Leonie Man   Anxiety    Pt does not like anything in her nose.   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   Depression    h/o   Dry eyes    Hx  of endometriosis    Hx of migraines    Ovarian cyst    72 years old   Ulcer    73 y o   Past Surgical History:  Procedure Laterality Date   ABDOMINAL HYSTERECTOMY  1987   Total   APPENDECTOMY     CHOLECYSTECTOMY     LAPAROSCOPY  1990   For Endometriosis   RIGHT OOPHORECTOMY  1970   right tube removed   TONSILLECTOMY     Family History  Problem Relation Age of Onset   Other Mother        murdered by husband   Other Father        killed by a drunk driver   Other Brother        unknown medical history   Other Brother        unknown medical history   CAD Other         GM, uncle   Diabetes Other        mother side    Colon cancer Neg Hx    Breast cancer Neg Hx    Social History   Socioeconomic History   Marital status: Married    Spouse name: Greggory Stallion   Number of children: 0   Years of education: Not on file   Highest education level: Not on file  Occupational History   Occupation: retired    Comment: Investment banker, corporate, Designer, industrial/product work  Tobacco Use   Smoking status: Never   Smokeless tobacco: Never  Substance and Sexual Activity   Alcohol use: Yes    Alcohol/week: 7.0 standard drinks of alcohol    Types: 7 Glasses of wine per week    Comment: 1 glass of wine daily   Drug use: No   Sexual activity: Not on file  Other Topics Concern   Not on file  Social History Narrative   Married to [place] no biological children, 1 step son who lives in [place]   Social Determinants of Health   Financial Resource Strain: Low Risk  (04/07/2023)   Overall Financial Resource Strain (CARDIA)    Difficulty of Paying Living Expenses: Not hard at all  Food Insecurity: No Food Insecurity (04/07/2023)   Hunger Vital Sign    Worried About Running Out of Food in the Last Year: Never true    Ran Out of Food in the Last Year: Never true  Transportation Needs: No Transportation Needs (04/07/2023)   PRAPARE - Administrator, Civil Service (Medical): No    Lack of Transportation (Non-Medical): No  Physical Activity: Sufficiently Active (04/07/2023)   Exercise Vital Sign    Days of Exercise per Week: 6 days    Minutes of Exercise per Session: 60 min  Stress: No Stress Concern Present (04/07/2023)   [employer] of Occupational Health - Occupational Stress Questionnaire    Feeling of Stress : Not at all  Social Connections: Socially Isolated (04/07/2023)   Social Connection and Isolation Panel [NHANES]    Frequency of Communication with Friends and Family: Once a week    Frequency of Social Gatherings with Friends and Family: Once a week    Attends  Religious Services: Never    Database administrator or Organizations: No    Attends Engineer, structural: Never    Marital Status: Married    Tobacco Counseling Counseling given: Not Answered   Clinical Intake:  Pre-visit preparation completed: Yes  Pain : No/denies pain     BMI -  recorded: 27.1 Nutritional Status: BMI 25 -29 Overweight Nutritional Risks: None Diabetes: No  How often do you need to have someone help you when you read instructions, pamphlets, or other written materials from your doctor or pharmacy?: 1 - Never  Interpreter Needed?: No  Information entered by :: Tora Kindred, CMA   Activities of Daily Living    04/07/2023    3:00 PM  In your present state of health, do you have any difficulty performing the following activities:  Hearing? 0  Vision? 0  Difficulty concentrating or making decisions? 0  Walking or climbing stairs? 0  Dressing or bathing? 0  Doing errands, shopping? 0  Preparing Food and eating ? N  Using the Toilet? N  In the past six months, have you accidently leaked urine? Y  Comment wears a panty liner  Do you have problems with loss of bowel control? N  Managing your Medications? N  Managing your Finances? N  Housekeeping or managing your Housekeeping? N    Patient Care Team: Wanda Plump, MD as PCP - Sondra Barges, Darl Pikes, MD as Consulting Physician (Dermatology) Carrington Clamp, MD as Consulting Physician (Obstetrics and Gynecology)  Indicate any recent Medical Services you may have received from other than [facility] providers in the past year (date may be approximate).     Assessment:   This is a routine wellness examination for Goddess.  Hearing/Vision screen Hearing Screening - Comments:: Denies hearing loss  Dietary issues and exercise activities discussed:     Goals Addressed               This Visit's Progress     Weight (lb) < 150 lb (68 kg) (pt-stated)   153 lb (69.4 kg)     Depression  Screen    04/07/2023    3:16 PM 01/14/2023    3:47 PM 04/28/2022    3:51 PM 03/29/2022    2:10 PM 06/17/2021    3:41 PM 03/19/2021    3:07 PM 11/06/2019   11:50 AM  PHQ 2/9 Scores  PHQ - 2 Score 0 0 0 0 0 0 0  PHQ- 9 Score 0          Fall Risk    04/07/2023    3:26 PM 01/14/2023    3:47 PM 04/28/2022    3:50 PM 03/29/2022    2:08 PM 06/17/2021    3:41 PM  Fall Risk   Falls in the past year? 0 0 0 0 0  Number falls in past yr: 0 0 0 0 0  Injury with Fall? 0 0 0 0 0  Risk for fall due to : No Fall Risks      Follow up Falls prevention discussed Falls evaluation completed Falls evaluation completed Falls prevention discussed Falls evaluation completed    MEDICARE RISK AT HOME:  Medicare Risk at Home - 04/07/23 1526     Any stairs in or around the home? No    If so, are there any without handrails? No    Home free of loose throw rugs in walkways, pet beds, electrical cords, etc? Yes    Adequate lighting in your home to reduce risk of falls? Yes    Life alert? No    Use of a cane, walker or w/c? No    Grab bars in the bathroom? No    Shower chair or bench in shower? Yes    Elevated toilet seat or a handicapped toilet? No  TIMED UP AND GO:  Was the test performed?  No    Cognitive Function:        04/07/2023    3:28 PM  6CIT Screen  What Year? 0 points  What month? 0 points  What time? 0 points  Count back from 20 0 points  Months in reverse 0 points  Repeat phrase 0 points  Total Score 0 points    Immunizations Immunization History  Administered Date(s) Administered   COVID-19, mRNA, vaccine(Comirnaty)12 years and older 07/12/2022   Fluad Quad(high Dose 65+) 06/17/2021, 07/28/2022   Influenza Split 07/01/2018, 05/29/2019   Influenza Whole 10/09/2010   Influenza, High Dose Seasonal PF 07/23/2016   Influenza-Unspecified 08/12/2014, 07/15/2015, 06/25/2020   PFIZER(Purple Top)SARS-COV-2 Vaccination 11/22/2019, 12/13/2019, 07/01/2020, 01/13/2021    Pfizer Covid-19 Vaccine Bivalent Booster 10yrs & up 06/26/2021   Pneumococcal Conjugate-13 04/15/2015   Pneumococcal Polysaccharide-23 07/23/2016   Respiratory Syncytial Virus Vaccine,Recomb Aduvanted(Arexvy) 08/23/2022   Tdap 04/15/2015   Zoster Recombinant(Shingrix) 07/10/2021, 04/20/2022   Zoster, Live 05/05/2010    TDAP status: Up to date  Flu Vaccine status: Up to date  Pneumococcal vaccine status: Up to date  Covid-19 vaccine status: Information provided on how to obtain vaccines.   Qualifies for Shingles Vaccine? Yes   Zostavax completed Yes   Shingrix Completed?: Yes  Screening Tests Health Maintenance  Topic Date Due   COVID-19 Vaccine (7 - 2023-24 season) 04/28/2023 (Originally 09/06/2022)   INFLUENZA VACCINE  04/14/2023   MAMMOGRAM  01/03/2024   Medicare Annual Wellness (AWV)  04/06/2024   DTaP/Tdap/Td (2 - Td or Tdap) 04/14/2025   DEXA SCAN  12/02/2025   Colonoscopy  07/13/2026   Pneumonia Vaccine 14+ Years old  Completed   Hepatitis C Screening  Completed   Zoster Vaccines- Shingrix  Completed   HPV VACCINES  Aged Out    Health Maintenance  There are no preventive care reminders to display for this patient.   Colorectal cancer screening: Type of screening: Colonoscopy. Completed 07/13/16. Repeat every 10 years  Mammogram status: Completed 01/03/23. Repeat every year  Bone Density status: Completed 12/02/20. Results reflect: Bone density results: NORMAL. Repeat every 2 years.  Lung Cancer Screening: (Low Dose CT Chest recommended if Age 65-80 years, 20 pack-year currently smoking OR have quit w/in 15years.) does not qualify.   Lung Cancer Screening Referral: n/a  Additional Screening:  Hepatitis C Screening: does qualify; Completed 07/03/16   Dental Screening: Recommended annual dental exams for proper oral hygiene   Community Resource Referral / Chronic Care Management: CRR required this visit?  No   CCM required this visit?  No     Plan:      I have personally reviewed and noted the following in the patient's chart:   Medical and social history Use of alcohol, tobacco or illicit drugs  Current medications and supplements including opioid prescriptions. Patient is not currently taking opioid prescriptions. Functional ability and status Nutritional status Physical activity Advanced directives List of other physicians Hospitalizations, surgeries, and ER visits in previous 12 months Vitals Screenings to include cognitive, depression, and falls Referrals and appointments  In addition, I have reviewed and discussed with patient certain preventive protocols, quality metrics, and best practice recommendations. A written personalized care plan for preventive services as well as general preventive health recommendations were provided to patient.     Tora Kindred, CMA   04/07/2023   After Visit Summary: (MyChart) Due to this being a telephonic visit, the after visit summary with  patients personalized plan was offered to patient via MyChart   Nurse Notes: Patient doing great!

## 2023-05-03 ENCOUNTER — Other Ambulatory Visit: Payer: Self-pay | Admitting: Internal Medicine

## 2023-07-22 ENCOUNTER — Ambulatory Visit: Payer: Medicare Other | Admitting: Internal Medicine

## 2023-08-04 ENCOUNTER — Encounter: Payer: Self-pay | Admitting: Internal Medicine

## 2023-08-05 ENCOUNTER — Ambulatory Visit: Payer: Medicare Other | Admitting: Internal Medicine

## 2023-08-08 ENCOUNTER — Encounter: Payer: Self-pay | Admitting: Internal Medicine

## 2023-08-08 ENCOUNTER — Ambulatory Visit (INDEPENDENT_AMBULATORY_CARE_PROVIDER_SITE_OTHER): Payer: Medicare Other | Admitting: Internal Medicine

## 2023-08-08 VITALS — BP 132/80 | HR 93 | Temp 98.2°F | Resp 16 | Ht 64.0 in | Wt 154.0 lb

## 2023-08-08 DIAGNOSIS — J069 Acute upper respiratory infection, unspecified: Secondary | ICD-10-CM | POA: Diagnosis not present

## 2023-08-08 DIAGNOSIS — E785 Hyperlipidemia, unspecified: Secondary | ICD-10-CM

## 2023-08-08 DIAGNOSIS — I10 Essential (primary) hypertension: Secondary | ICD-10-CM | POA: Diagnosis not present

## 2023-08-08 NOTE — Patient Instructions (Addendum)
For your cold: Rest, drink lots of fluids, Tylenol. Okay to continue Alka-Seltzer and  Afrin nasal spray, only for few more days. Call if not gradually better  Vaccines I recommend after you cold is better: Covid booster Flu shot     Next visit with me 4 to 5 months.  Please schedule it at the front desk

## 2023-08-08 NOTE — Progress Notes (Unsigned)
Subjective:    Patient ID: Kelly Schneider, female    DOB: Sep 12, 1951, 72 y.o.   MRN: [id number]  DOS:  08/08/2023 Type of visit - description: Checkup  Developed a cold about a week ago: Runny nose, mild cough, had a headache at first. No fever or chills, no bodyaches. Does feel tired. A COVID test was negative yesterday. Her husband has similar symptoms. Other than that she feels great.  Very active.  Review of Systems See above   Past Medical History:  Diagnosis Date   Actinic keratosis    Right superior central posterior tibial shave, Dr. Leonie Man   Anxiety    Pt does not like anything in her nose.   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   Depression    h/o   Dry eyes    Hx of endometriosis    Hx of migraines    Ovarian cyst    72 years old   Ulcer    27 y o    Past Surgical History:  Procedure Laterality Date   ABDOMINAL HYSTERECTOMY  1987   Total   APPENDECTOMY     CHOLECYSTECTOMY     LAPAROSCOPY  1990   For Endometriosis   RIGHT OOPHORECTOMY  1970   right tube removed   TONSILLECTOMY      Current Outpatient Medications  Medication Instructions   amLODipine (NORVASC) 5 mg, Oral, Daily   Ascorbic Acid (VITAMIN C PO) 375 mg, Oral, Daily   aspirin EC 81 mg, Oral, Daily   atorvastatin (LIPITOR) 10 mg, Oral, Daily at bedtime   Calcium-Vitamin D-Vitamin K (VIACTIV PO) Oral, Viactive chews-Take 1 pill bid    Cranberry-Vitamin C-Probiotic (AZO CRANBERRY) 250-30 MG TABS 2 tablets, Daily   cycloSPORINE (RESTASIS) 0.05 % ophthalmic emulsion 1 drop, Both Eyes, 2 times daily   DHA-EPA-Flaxseed Oil-Vitamin E (THERA TEARS NUTRITION PO) Oral, Take 2 capsules daily   estradiol (ESTRACE) 1 mg, Oral, Daily   Magnesium 200 mg, Oral, 2 times daily   Multiple Vitamins-Minerals (ONE-A-DAY 50 PLUS PO) Oral, Daily   Plant Stanol Ester (BENECOL PO) Oral, 6 chews daily<BR>   Potassium (POTASSIMIN PO) 550 mg, Oral, Daily    TURMERIC PO 1 tablet, Oral, Daily       Objective:   Physical Exam BP 132/80   Pulse 93   Temp 98.2 F (36.8 C) (Oral)   Resp 16   Ht 5\' 4"  (1.626 m)   Wt 154 lb (69.9 kg)   SpO2 99%   BMI 26.43 kg/m  General:   Well developed, NAD, BMI noted. HEENT:  Normocephalic . Face symmetric, atraumatic. Nose slightly congested. Throat: Symmetric, not red.  Lungs:  CTA B Normal respiratory effort, no intercostal retractions, no accessory muscle use. Heart: RRR,  no murmur.  Lower extremities: no pretibial edema bilaterally  Skin: Not pale. Not jaundice Neurologic:  alert & oriented X3.  Speech normal, gait appropriate for age and unassisted Psych--  Cognition and judgment appear intact.  Cooperative with normal attention span and concentration.  Behavior appropriate. No anxious or depressed appearing.      Assessment      ASSESSMENT HTN H/o Anxiety depression Osteopenia: DEXA normal 2016 and 11/2020  Actinic keratosis History of migraines Dry eyes  DOE: Low risk Myoview 12/30-20 20  PLAN URI: URI for several days, she takes occasional Alka-Seltzer and afrin, that is okay for for days.  Also recommend rest, fluids, Tylenol. Call if not  gradually better. Dyslipidemia: Well-controlled, continue atorvastatin. HTN: BP is very good today, ambulatory BPs between 116 and 130.  Continue amlodipine. Preventive care: Flu shot and COVID-vaccine once better. RTC 4 to 5 months   ====5- HTN: Ambulatory BPs consistently in the 120s, on amlodipine, check labs. Dyslipidemia: Diet controlled, CV RF increased mostly due to age.  Statins were discussed.  She would prefer not to take any more medications, eventually we agreed that as long as she is keeping her LDL very good (currently 88) we won't start statins Aspirin: Discussed pros and cons, again cardiovascular risk elevated, will stay on aspirin for now. HRT: On estrogen as recommended by gynecology. Preventive care reviewed.  See  separate documentation. RTC 6 months.

## 2023-08-09 NOTE — Assessment & Plan Note (Signed)
URI: URI for several days, she takes occasional Alka-Seltzer and afrin, that is okay for for days.  Also recommend rest, fluids, Tylenol. Call if not gradually better. Dyslipidemia: Well-controlled, continue atorvastatin. HTN: BP is very good today, ambulatory BPs between 116 and 130.  Continue amlodipine. Preventive care:proceed w/ a  Flu shot and COVID-vaccine once better. RTC 4 to 5 months

## 2023-10-07 DIAGNOSIS — Z23 Encounter for immunization: Secondary | ICD-10-CM | POA: Diagnosis not present

## 2023-11-04 ENCOUNTER — Other Ambulatory Visit (HOSPITAL_BASED_OUTPATIENT_CLINIC_OR_DEPARTMENT_OTHER): Payer: Self-pay

## 2023-11-04 DIAGNOSIS — Z23 Encounter for immunization: Secondary | ICD-10-CM | POA: Diagnosis not present

## 2023-11-04 MED ORDER — INFLUENZA VAC A&B SURF ANT ADJ 0.5 ML IM SUSY
0.5000 mL | PREFILLED_SYRINGE | Freq: Once | INTRAMUSCULAR | 0 refills | Status: AC
  Filled 2023-11-04: qty 0.5, 1d supply, fill #0

## 2023-12-12 DIAGNOSIS — H04212 Epiphora due to excess lacrimation, left lacrimal gland: Secondary | ICD-10-CM | POA: Diagnosis not present

## 2023-12-12 DIAGNOSIS — H02825 Cysts of left lower eyelid: Secondary | ICD-10-CM | POA: Diagnosis not present

## 2023-12-12 DIAGNOSIS — H04123 Dry eye syndrome of bilateral lacrimal glands: Secondary | ICD-10-CM | POA: Diagnosis not present

## 2023-12-12 DIAGNOSIS — H2513 Age-related nuclear cataract, bilateral: Secondary | ICD-10-CM | POA: Diagnosis not present

## 2024-01-06 ENCOUNTER — Encounter: Payer: Self-pay | Admitting: Internal Medicine

## 2024-01-06 ENCOUNTER — Ambulatory Visit: Payer: Medicare Other | Admitting: Internal Medicine

## 2024-01-06 VITALS — BP 138/80 | HR 93 | Temp 98.1°F | Resp 16 | Ht 64.0 in | Wt 149.5 lb

## 2024-01-06 DIAGNOSIS — J302 Other seasonal allergic rhinitis: Secondary | ICD-10-CM

## 2024-01-06 DIAGNOSIS — E785 Hyperlipidemia, unspecified: Secondary | ICD-10-CM | POA: Diagnosis not present

## 2024-01-06 DIAGNOSIS — I10 Essential (primary) hypertension: Secondary | ICD-10-CM | POA: Diagnosis not present

## 2024-01-06 NOTE — Progress Notes (Signed)
 Subjective:    Patient ID: Kelly Schneider, female    DOB: 1951/05/06, 73 y.o.   MRN: [id number]  DOS:  01/06/2024 Type of visit - description: Follow-up  Here for routine checkup. Chronic medical problems addressed. Feels well. It is having some issues with sinus congestion at night, took Zyrtec-D twice this week. No recent ambulatory BPs.  Denies chest pain or difficulty breathing.  No edema.  Review of Systems See above   Past Medical History:  Diagnosis Date   Actinic keratosis    Right superior central posterior tibial shave, Dr. Ivonne Marry   Anxiety    Pt does not like anything in her nose.   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   Depression    h/o   Dry eyes    Hx of endometriosis    Hx of migraines    Ovarian cyst    73 years old   Ulcer    82 y o    Past Surgical History:  Procedure Laterality Date   ABDOMINAL HYSTERECTOMY  1987   Total   APPENDECTOMY     CHOLECYSTECTOMY     LAPAROSCOPY  1990   For Endometriosis   RIGHT OOPHORECTOMY  1970   right tube removed   TONSILLECTOMY      Current Outpatient Medications  Medication Instructions   amLODipine  (NORVASC ) 5 mg, Oral, Daily   Ascorbic Acid (VITAMIN C PO) 375 mg, Daily   aspirin EC 81 mg, Daily   atorvastatin  (LIPITOR) 10 mg, Oral, Daily at bedtime   Calcium -Vitamin D-Vitamin K (VIACTIV PO) Take by mouth. Viactive chews-Take 1 pill bid   cetirizine-pseudoephedrine (ZYRTEC-D) 5-120 MG tablet 1 tablet, 2 times daily PRN   cycloSPORINE (RESTASIS) 0.05 % ophthalmic emulsion 1 drop, 2 times daily   DHA-EPA-Flaxseed Oil-Vitamin E (THERA TEARS NUTRITION PO) Take by mouth. Take 2 capsules daily   estradiol (ESTRACE) 1 mg, Daily   Magnesium 200 mg, 2 times daily   Multiple Vitamins-Minerals (ONE-A-DAY 50 PLUS PO) Daily   Plant Stanol Ester (BENECOL PO) Take by mouth. 6 chews daily   Potassium (POTASSIMIN PO) 550 mg, Daily   TURMERIC PO 1 tablet, Daily        Objective:   Physical Exam BP 138/80   Pulse 93   Temp 98.1 F (36.7 C) (Oral)   Resp 16   Ht 5\' 4"  (1.626 m)   Wt 149 lb 8 oz (67.8 kg)   SpO2 99%   BMI 25.66 kg/m  General:   Well developed, NAD, BMI noted. HEENT:  Normocephalic . Face symmetric, atraumatic; nose slt congested Lungs:  CTA B Normal respiratory effort, no intercostal retractions, no accessory muscle use. Heart: RRR,  no murmur.  Lower extremities: no pretibial edema bilaterally  Skin: Not pale. Not jaundice Neurologic:  alert & oriented X3.  Speech normal, gait appropriate for age and unassisted Psych--  Cognition and judgment appear intact.  Cooperative with normal attention span and concentration.  Behavior appropriate. No anxious or depressed appearing.       Assessment   ASSESSMENT HTN Dyslipidemia H/o Anxiety depression Osteopenia: DEXA normal 2016 and 11/2020  Actinic keratosis History of migraines Dry eyes  DOE: Low risk Myoview  08/2019  PLAN HTN: On amlodipine , takes OTC potassium.  BP today satisfactory.  Recommend to check ambulatory BPs from time to time, check a BMP and CBC. Dyslipidemia: On atorvastatin  10 mg, check FLP AST ALT TSH. Allergies: Okay to use Zyrtec-D  on limited basis.  If she has ongoing symptoms try Zyrtec and Flonase, see AVS Lifestyle: Very active, does stationary bike almost daily Preventive care:  pt to check if she had a PNM 20 recently.  Will see gynecology and have a mammogram June 6 RTC around 5 months

## 2024-01-06 NOTE — Patient Instructions (Signed)
 INSTRUCTIONS  FOR TODAY  Please check your pharmacy and see if you have a pneumonia shot recently (it is called PNM 20)  For allergies: Okay to take Zyrtec-D for few days but if you are planning to take it for more than a week you may like to switch to: Zyrtec 10 mg once daily Flonase 2 sprays on each side of the nose at nighttime.  Check the  blood pressure regularly Blood pressure goal:  between 110/65 and  135/85. If it is consistently higher or lower, let me know     GO TO THE LAB : Get the blood work     Next office visit for a checkup in 5 to 6 months Please make an appointment before you leave today

## 2024-01-07 LAB — CBC WITH DIFFERENTIAL/PLATELET
Absolute Lymphocytes: 1813 {cells}/uL (ref 850–3900)
Absolute Monocytes: 651 {cells}/uL (ref 200–950)
Basophils Absolute: 70 {cells}/uL (ref 0–200)
Basophils Relative: 1 %
Eosinophils Absolute: 231 {cells}/uL (ref 15–500)
Eosinophils Relative: 3.3 %
HCT: 41.2 % (ref 35.0–45.0)
Hemoglobin: 13.9 g/dL (ref 11.7–15.5)
MCH: 30.1 pg (ref 27.0–33.0)
MCHC: 33.7 g/dL (ref 32.0–36.0)
MCV: 89.2 fL (ref 80.0–100.0)
MPV: 10.8 fL (ref 7.5–12.5)
Monocytes Relative: 9.3 %
Neutro Abs: 4235 {cells}/uL (ref 1500–7800)
Neutrophils Relative %: 60.5 %
Platelets: 278 10*3/uL (ref 140–400)
RBC: 4.62 10*6/uL (ref 3.80–5.10)
RDW: 12.6 % (ref 11.0–15.0)
Total Lymphocyte: 25.9 %
WBC: 7 10*3/uL (ref 3.8–10.8)

## 2024-01-07 LAB — ALT: ALT: 19 U/L (ref 6–29)

## 2024-01-07 LAB — BASIC METABOLIC PANEL WITH GFR
BUN: 13 mg/dL (ref 7–25)
CO2: 27 mmol/L (ref 20–32)
Calcium: 10 mg/dL (ref 8.6–10.4)
Chloride: 101 mmol/L (ref 98–110)
Creat: 0.69 mg/dL (ref 0.60–1.00)
Glucose, Bld: 93 mg/dL (ref 65–99)
Potassium: 4.5 mmol/L (ref 3.5–5.3)
Sodium: 138 mmol/L (ref 135–146)
eGFR: 90 mL/min/{1.73_m2} (ref >=60)

## 2024-01-07 LAB — LIPID PANEL
Cholesterol: 177 mg/dL (ref <200)
HDL: 112 mg/dL (ref >=50)
LDL Cholesterol (Calc): 48 mg/dL
Non-HDL Cholesterol (Calc): 65 mg/dL (ref <130)
Total CHOL/HDL Ratio: 1.6 (calc) (ref <5.0)
Triglycerides: 83 mg/dL (ref <150)

## 2024-01-07 LAB — AST: AST: 18 U/L (ref 10–35)

## 2024-01-07 LAB — TSH: TSH: 2.86 m[IU]/L (ref 0.40–4.50)

## 2024-01-08 NOTE — Assessment & Plan Note (Signed)
 HTN: On amlodipine , takes OTC potassium.  BP today satisfactory.  Recommend to check ambulatory BPs from time to time, check a BMP and CBC. Dyslipidemia: On atorvastatin  10 mg, check FLP AST ALT TSH. Allergies: Okay to use Zyrtec-D on limited basis.  If she has ongoing symptoms try Zyrtec and Flonase, see AVS Lifestyle: Very active, does stationary bike almost daily Preventive care:  pt to check if she had a PNM 20 recently.  Will see gynecology and have a mammogram June 6 RTC around 5 months

## 2024-01-10 ENCOUNTER — Other Ambulatory Visit: Payer: Self-pay | Admitting: Internal Medicine

## 2024-01-11 ENCOUNTER — Encounter: Payer: Self-pay | Admitting: Internal Medicine

## 2024-01-30 ENCOUNTER — Other Ambulatory Visit (HOSPITAL_BASED_OUTPATIENT_CLINIC_OR_DEPARTMENT_OTHER): Payer: Self-pay

## 2024-01-30 DIAGNOSIS — Z23 Encounter for immunization: Secondary | ICD-10-CM | POA: Diagnosis not present

## 2024-01-30 MED ORDER — PNEUMOCOCCAL 20-VAL CONJ VACC 0.5 ML IM SUSY
0.5000 mL | PREFILLED_SYRINGE | Freq: Once | INTRAMUSCULAR | 0 refills | Status: AC
  Filled 2024-01-30: qty 0.5, 1d supply, fill #0

## 2024-02-17 DIAGNOSIS — Z01419 Encounter for gynecological examination (general) (routine) without abnormal findings: Secondary | ICD-10-CM | POA: Diagnosis not present

## 2024-02-17 DIAGNOSIS — Z1231 Encounter for screening mammogram for malignant neoplasm of breast: Secondary | ICD-10-CM | POA: Diagnosis not present

## 2024-02-17 LAB — HM MAMMOGRAPHY

## 2024-02-20 ENCOUNTER — Encounter: Payer: Self-pay | Admitting: Internal Medicine

## 2024-02-22 ENCOUNTER — Telehealth: Payer: Self-pay | Admitting: *Deleted

## 2024-02-22 NOTE — Telephone Encounter (Signed)
 Sent mychart to pt to r/s AWV on 04/11/24.

## 2024-02-29 ENCOUNTER — Encounter: Payer: Self-pay | Admitting: Internal Medicine

## 2024-04-10 ENCOUNTER — Other Ambulatory Visit: Payer: Self-pay | Admitting: Internal Medicine

## 2024-05-02 ENCOUNTER — Ambulatory Visit (INDEPENDENT_AMBULATORY_CARE_PROVIDER_SITE_OTHER): Admitting: *Deleted

## 2024-05-02 VITALS — Ht 64.0 in | Wt 149.0 lb

## 2024-05-02 DIAGNOSIS — Z0000 Encounter for general adult medical examination without abnormal findings: Secondary | ICD-10-CM

## 2024-05-02 NOTE — Patient Instructions (Addendum)
 Ms. Vecchio , Thank you for taking time out of your busy schedule to complete your Annual Wellness Visit with me. I enjoyed our conversation and look forward to speaking with you again next year. I, as well as your care team,  appreciate your ongoing commitment to your health goals. Please review the following plan we discussed and let me know if I can assist you in the future. Your Game plan/ To Do List     Follow up Visits: Next Medicare AWV with our clinical staff:  05/07/25  3pm   Next Office Visit with your provider: 07/13/24  4pm  Clinician Recommendations:  Aim for 30 minutes of exercise or brisk walking, 6-8 glasses of water, and 5 servings of fruits and vegetables each day.        This is a list of the screening recommended for you and due dates:  Health Maintenance  Topic Date Due   Medicare Annual Wellness Visit  04/06/2024   Flu Shot  04/13/2024   COVID-19 Vaccine (8 - Pfizer risk 2024-25 season) 04/10/2025*   DTaP/Tdap/Td vaccine (3 - Td or Tdap) 04/14/2025   Colon Cancer Screening  07/13/2026   Pneumococcal Vaccine for age over 56  Completed   DEXA scan (bone density measurement)  Completed   Hepatitis C Screening  Completed   Zoster (Shingles) Vaccine  Completed   HPV Vaccine  Aged Out   Meningitis B Vaccine  Aged Out  *Topic was postponed. The date shown is not the original due date.    See attachments for Preventive Care and Fall Prevention Tips.

## 2024-05-02 NOTE — Progress Notes (Signed)
 Subjective:   Kelly Schneider is a 73 y.o. who presents for a Medicare Wellness preventive visit.  As a reminder, Annual Wellness Visits don't include a physical exam, and some assessments may be limited, especially if this visit is performed virtually. We may recommend an in-person follow-up visit with your provider if needed.  Visit Complete: Virtual I connected with  [name] on 05/02/24 by a audio enabled telemedicine application and verified that I am speaking with the correct person using two identifiers.  Patient Location: Home  Provider Location: Office/Clinic  I discussed the limitations of evaluation and management by telemedicine. The patient expressed understanding and agreed to proceed.  Vital Signs: Because this visit was a virtual/telehealth visit, some criteria may be missing or patient reported. Any vitals not documented were not able to be obtained and vitals that have been documented are patient reported.  VideoDeclined- This patient declined Librarian, academic. Therefore the visit was completed with audio only.  Persons Participating in Visit: Patient.  AWV Questionnaire: No: Patient Medicare AWV questionnaire was not completed prior to this visit.  Cardiac Risk Factors include: advanced age (>25men, >75 women);dyslipidemia;hypertension     Objective:    Today's Vitals   05/02/24 1501  Weight: 149 lb (67.6 kg)  Height: 5' 4 (1.626 m)   Body mass index is 25.58 kg/m.     05/02/2024    3:25 PM 04/07/2023    3:25 PM 03/29/2022    2:06 PM 03/19/2021    3:05 PM 11/06/2019   11:47 AM 07/13/2016   10:10 AM  Advanced Directives  Does Patient Have a Medical Advance Directive? Yes Yes Yes Yes Yes Yes   Type of Advance Directive Living will Healthcare Power of [place];Living will Healthcare Power of [place];Living will Healthcare Power of [place];Living will Healthcare Power of [place];Living will   Does patient want to  make changes to medical advance directive? No - Patient declined No - Patient declined   No - Patient declined   Copy of Healthcare Power of Attorney in Chart?  Yes - validated most recent copy scanned in chart (See row information) Yes - validated most recent copy scanned in chart (See row information) Yes - validated most recent copy scanned in chart (See row information) No - copy requested      Data saved with a previous flowsheet row definition    Current Medications (verified) Outpatient Encounter Medications as of 05/02/2024  Medication Sig   amLODipine  (NORVASC ) 5 MG tablet Take 1 tablet (5 mg total) by mouth daily.   Ascorbic Acid (VITAMIN C PO) Take 375 mg by mouth daily.   aspirin EC 81 MG tablet Take 81 mg by mouth daily.   atorvastatin  (LIPITOR) 10 MG tablet Take 1 tablet (10 mg total) by mouth at bedtime.   Calcium -Vitamin D-Vitamin K (VIACTIV PO) Take by mouth. Viactive chews-Take 1 pill bid   cetirizine-pseudoephedrine (ZYRTEC-D) 5-120 MG tablet Take 1 tablet by mouth 2 (two) times daily as needed for allergies or rhinitis.   cycloSPORINE (RESTASIS) 0.05 % ophthalmic emulsion Place 1 drop into both eyes 2 (two) times daily.   DHA-EPA-Flaxseed Oil-Vitamin E (THERA TEARS NUTRITION PO) Take by mouth. Take 2 capsules daily   estradiol (ESTRACE) 1 MG tablet Take 1 mg by mouth daily.   Magnesium 200 MG TABS Take 200 mg by mouth in the morning and at bedtime.   Multiple Vitamins-Minerals (ONE-A-DAY 50 PLUS PO) Take by mouth daily.   TURMERIC PO  Take 1 tablet by mouth daily.   [DISCONTINUED] Plant Stanol Ester (BENECOL PO) Take by mouth. 6 chews daily (Patient not taking: Reported on 05/02/2024)   [DISCONTINUED] Potassium (POTASSIMIN PO) Take 550 mg by mouth daily.   No facility-administered encounter medications on file as of 05/02/2024.    Allergies (verified) Penicillins   History: Past Medical History:  Diagnosis Date   Actinic keratosis    Right superior central posterior  tibial shave, Dr. Court   Anxiety    Pt does not like anything in her nose.   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   Depression    h/o   Dry eyes    Hx of endometriosis    Hx of migraines    Ovarian cyst    73 years old   Ulcer    8 y o   Past Surgical History:  Procedure Laterality Date   ABDOMINAL HYSTERECTOMY  1987   Total   APPENDECTOMY     CHOLECYSTECTOMY     LAPAROSCOPY  1990   For Endometriosis   RIGHT OOPHORECTOMY  1970   right tube removed   TONSILLECTOMY     Family History  Problem Relation Age of Onset   Other Mother        murdered by husband   Other Father        killed by a drunk driver   Other Brother        unknown medical history   Other Brother        unknown medical history   CAD Other        GM, uncle   Diabetes Other        mother side    Colon cancer Neg Hx    Breast cancer Neg Hx    Social History   Socioeconomic History   Marital status: Married    Spouse name: Zachary   Number of children: 0   Years of education: Not on file   Highest education level: Not on file  Occupational History   Occupation: retired    Comment: Investment banker, corporate, Designer, industrial/product work  Tobacco Use   Smoking status: Never   Smokeless tobacco: Never  Substance and Sexual Activity   Alcohol use: Yes    Alcohol/week: 7.0 standard drinks of alcohol    Types: 7 Glasses of wine per week    Comment: 1 glass of wine daily   Drug use: No   Sexual activity: Not on file  Other Topics Concern   Not on file  Social History Narrative   Married to [place] no biological children, 1 step son who lives in [place]   Social Drivers of Health   Financial Resource Strain: Low Risk  (05/02/2024)   Overall Financial Resource Strain (CARDIA)    Difficulty of Paying Living Expenses: Not very hard  Food Insecurity: No Food Insecurity (05/02/2024)   Hunger Vital Sign    Worried About Running Out of Food in the Last Year: Never true     Ran Out of Food in the Last Year: Never true  Transportation Needs: No Transportation Needs (05/02/2024)   PRAPARE - Administrator, Civil Service (Medical): No    Lack of Transportation (Non-Medical): No  Physical Activity: Sufficiently Active (05/02/2024)   Exercise Vital Sign    Days of Exercise per Week: 6 days    Minutes of Exercise per Session: 60 min  Stress: No Stress Concern Present (05/02/2024)  [employer] of Occupational Health - Occupational Stress Questionnaire    Feeling of Stress: Only a little  Social Connections: Socially Isolated (05/02/2024)   Social Connection and Isolation Panel    Frequency of Communication with Friends and Family: Never    Frequency of Social Gatherings with Friends and Family: Never    Attends Religious Services: Never    Database administrator or Organizations: No    Attends Engineer, structural: Never    Marital Status: Married    Tobacco Counseling Counseling given: Not Answered    Clinical Intake:  Pre-visit preparation completed: Yes  Pain : No/denies pain     BMI - recorded: 25.58 Nutritional Status: BMI 25 -29 Overweight Nutritional Risks: None Diabetes: No  Lab Results  Component Value Date   HGBA1C 5.1 07/23/2016     How often do you need to have someone help you when you read instructions, pamphlets, or other written materials from your doctor or pharmacy?: 1 - Never  Interpreter Needed?: No  Information entered by :: Lolita Libra, CMA   Activities of Daily Living     05/02/2024    3:08 PM  In your present state of health, do you have any difficulty performing the following activities:  Hearing? 0  Vision? 0  Difficulty concentrating or making decisions? 0  Walking or climbing stairs? 0  Dressing or bathing? 0  Doing errands, shopping? 0  Preparing Food and eating ? N  Using the Toilet? N  In the past six months, have you accidently leaked urine? Y  Comment sometimes  with sneezing.  Do you have problems with loss of bowel control? N  Managing your Medications? N  Managing your Finances? N  Housekeeping or managing your Housekeeping? N    Patient Care Team: Amon Aloysius BRAVO, MD as PCP - Diedre Forward, Devere, MD as Consulting Physician (Dermatology) Sarrah Browning, MD as Consulting Physician (Obstetrics and Gynecology) Marcey Elspeth PARAS, MD as Consulting Physician (Ophthalmology)  I have updated your Care Teams any recent Medical Services you may have received from other providers in the past year.     Assessment:   This is a routine wellness examination for Hanaa.  Hearing/Vision screen Hearing Screening - Comments:: Denies hearing difficulties.  Vision Screening - Comments:: Last eye exam 12/12/23 with [facility], Dr Marcey   Goals Addressed   None    Depression Screen     05/02/2024    3:24 PM 01/06/2024    3:48 PM 08/08/2023    3:48 PM 04/07/2023    3:16 PM 01/14/2023    3:47 PM 04/28/2022    3:51 PM 03/29/2022    2:10 PM  PHQ 2/9 Scores  PHQ - 2 Score 0 0 0 0 0 0 0  PHQ- 9 Score    0       Fall Risk     05/02/2024    3:07 PM 01/06/2024    3:48 PM 08/08/2023    3:48 PM 04/07/2023    3:26 PM 01/14/2023    3:47 PM  Fall Risk   Falls in the past year? 0 0 0 0 0  Number falls in past yr: 0 0 0 0 0  Injury with Fall? 0 0 0 0 0  Risk for fall due to :    No Fall Risks   Follow up Education provided Falls evaluation completed;Education provided Falls evaluation completed Falls prevention discussed Falls evaluation completed    MEDICARE RISK AT HOME:  Medicare Risk at Home Any stairs in or around the home?: No If so, are there any without handrails?: No Home free of loose throw rugs in walkways, pet beds, electrical cords, etc?: Yes Adequate lighting in your home to reduce risk of falls?: Yes Life alert?: No Use of a cane, walker or w/c?: No Grab bars in the bathroom?: No Shower chair or bench in shower?: Yes Elevated toilet  seat or a handicapped toilet?: No  TIMED UP AND GO:  Was the test performed?  No,audio  Cognitive Function: 6CIT completed        05/02/2024    3:25 PM 04/07/2023    3:28 PM  6CIT Screen  What Year? 0 points 0 points  What month? 0 points 0 points  What time? 0 points 0 points  Count back from 20 0 points 0 points  Months in reverse 0 points 0 points  Repeat phrase 0 points 0 points  Total Score 0 points 0 points    Immunizations Immunization History  Administered Date(s) Administered   Fluad  Quad(high Dose 65+) 06/17/2021, 07/28/2022   Fluad  Trivalent(High Dose 65+) 11/04/2023   Influenza Split 07/01/2018, 05/29/2019   Influenza Whole 10/09/2010   Influenza, High Dose Seasonal PF 07/23/2016   Influenza-Unspecified 08/12/2014, 07/15/2015, 06/25/2020   Moderna Covid-19 Fall Seasonal Vaccine 11yrs & older 10/07/2023   PFIZER(Purple Top)SARS-COV-2 Vaccination 11/22/2019, 12/13/2019, 07/01/2020, 01/13/2021   PNEUMOCOCCAL CONJUGATE-20 01/30/2024   Pfizer Covid-19 Vaccine Bivalent Booster 32yrs & up 06/26/2021   Pfizer(Comirnaty )Fall Seasonal Vaccine 12 years and older 07/12/2022   Pneumococcal Conjugate-13 04/15/2015   Pneumococcal Polysaccharide-23 07/23/2016   Respiratory Syncytial Virus Vaccine ,Recomb Aduvanted(Arexvy ) 08/23/2022   Td 06/27/2001   Tdap 04/15/2015   Zoster Recombinant(Shingrix) 07/10/2021, 04/20/2022   Zoster, Live 05/05/2010    Screening Tests Health Maintenance  Topic Date Due   Medicare Annual Wellness (AWV)  04/06/2024   INFLUENZA VACCINE  04/13/2024   COVID-19 Vaccine (8 - Pfizer risk 2024-25 season) 04/10/2025 (Originally 04/05/2024)   DTaP/Tdap/Td (3 - Td or Tdap) 04/14/2025   Colonoscopy  07/13/2026   Pneumococcal Vaccine: 50+ Years  Completed   DEXA SCAN  Completed   Hepatitis C Screening  Completed   Zoster Vaccines- Shingrix  Completed   HPV VACCINES  Aged Out   Meningococcal B Vaccine  Aged Out    Health Maintenance  Health  Maintenance Due  Topic Date Due   Medicare Annual Wellness (AWV)  04/06/2024   INFLUENZA VACCINE  04/13/2024   Health Maintenance Items Addressed: Will consider getting flu vaccine this fall. Will discuss Bone density with PCP.  Additional Screening:  Vision Screening: Recommended annual ophthalmology exams for early detection of glaucoma and other disorders of the eye. Would you like a referral to an eye doctor? No    Dental Screening: Recommended annual dental exams for proper oral hygiene  Community Resource Referral / Chronic Care Management: CRR required this visit?  No   CCM required this visit?  No   Plan:    I have personally reviewed and noted the following in the patient's chart:   Medical and social history Use of alcohol, tobacco or illicit drugs  Current medications and supplements including opioid prescriptions. Patient is not currently taking opioid prescriptions. Functional ability and status Nutritional status Physical activity Advanced directives List of other physicians Hospitalizations, surgeries, and ER visits in previous 12 months Vitals Screenings to include cognitive, depression, and falls Referrals and appointments  In addition, I have reviewed and discussed with patient  certain preventive protocols, quality metrics, and best practice recommendations. A written personalized care plan for preventive services as well as general preventive health recommendations were provided to patient.   Lolita Libra, CMA   05/02/2024   After Visit Summary: (MyChart) Due to this being a telephonic visit, the after visit summary with patients personalized plan was offered to patient via MyChart   Notes: Nothing significant to report at this time.

## 2024-05-11 ENCOUNTER — Telehealth: Payer: Self-pay

## 2024-05-11 NOTE — Telephone Encounter (Signed)
 Copied from CRM [id number]. Topic: General - Other >> May 11, 2024  3:31 PM Dedra B wrote: Reason for CRM: Pt called for [place]. Wanted to tell her something as a follow up to her wellness visit. Pls call pt.

## 2024-05-11 NOTE — Telephone Encounter (Signed)
Call completed, questions answered

## 2024-07-13 ENCOUNTER — Ambulatory Visit: Admitting: Internal Medicine

## 2024-07-27 ENCOUNTER — Other Ambulatory Visit: Payer: Self-pay | Admitting: Internal Medicine

## 2024-08-23 DIAGNOSIS — Z23 Encounter for immunization: Secondary | ICD-10-CM | POA: Diagnosis not present

## 2024-09-25 ENCOUNTER — Other Ambulatory Visit (HOSPITAL_BASED_OUTPATIENT_CLINIC_OR_DEPARTMENT_OTHER): Payer: Self-pay

## 2024-09-25 MED ORDER — COMIRNATY 30 MCG/0.3ML IM SUSY
0.3000 mL | PREFILLED_SYRINGE | Freq: Once | INTRAMUSCULAR | 0 refills | Status: AC
  Filled 2024-09-25: qty 0.3, 1d supply, fill #0

## 2024-09-29 ENCOUNTER — Other Ambulatory Visit: Payer: Self-pay | Admitting: Internal Medicine

## 2024-10-19 ENCOUNTER — Ambulatory Visit: Admitting: Internal Medicine

## 2024-11-20 ENCOUNTER — Ambulatory Visit: Admitting: Internal Medicine

## 2025-05-07 ENCOUNTER — Ambulatory Visit
